# Patient Record
Sex: Male | Born: 1975
Health system: Southern US, Community
[De-identification: ages and names within clinical notes are randomized; demographics above are authoritative.]

## PROBLEM LIST (undated history)

## (undated) DIAGNOSIS — E781 Pure hyperglyceridemia: Secondary | ICD-10-CM

## (undated) DIAGNOSIS — R740 Nonspecific elevation of levels of transaminase and lactic acid dehydrogenase [LDH]: Secondary | ICD-10-CM

## (undated) DIAGNOSIS — I10 Essential (primary) hypertension: Secondary | ICD-10-CM

## (undated) DIAGNOSIS — E8881 Metabolic syndrome: Secondary | ICD-10-CM

## (undated) DIAGNOSIS — R030 Elevated blood-pressure reading, without diagnosis of hypertension: Secondary | ICD-10-CM

## (undated) HISTORY — DX: Metabolic syndrome: E88.81

## (undated) HISTORY — DX: Essential (primary) hypertension: I10

## (undated) HISTORY — DX: Elevated blood-pressure reading, without diagnosis of hypertension: R03.0

## (undated) HISTORY — DX: Morbid (severe) obesity due to excess calories: E66.01

## (undated) HISTORY — DX: Nonspecific elevation of levels of transaminase and lactic acid dehydrogenase (ldh): R74.0

## (undated) HISTORY — DX: Pure hyperglyceridemia: E78.1

---
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
## 2005-11-08 ENCOUNTER — Emergency Department (HOSPITAL_COMMUNITY): Admission: EM | Admit: 2005-11-08 | Discharge: 2005-11-08 | Payer: Self-pay | Admitting: Emergency Medicine

## 2007-12-28 ENCOUNTER — Emergency Department (HOSPITAL_BASED_OUTPATIENT_CLINIC_OR_DEPARTMENT_OTHER): Admission: EM | Admit: 2007-12-28 | Discharge: 2007-12-28 | Payer: Self-pay | Admitting: Emergency Medicine

## 2007-12-28 ENCOUNTER — Ambulatory Visit: Payer: Self-pay | Admitting: Diagnostic Radiology
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
## 2008-05-01 ENCOUNTER — Ambulatory Visit: Payer: Self-pay | Admitting: Internal Medicine

## 2008-05-01 DIAGNOSIS — R002 Palpitations: Secondary | ICD-10-CM | POA: Insufficient documentation

## 2008-05-01 LAB — CONVERTED CEMR LAB
BUN: 8 mg/dL (ref 6–23)
Basophils Absolute: 0.1 10*3/uL (ref 0.0–0.1)
Bilirubin, Direct: 0 mg/dL (ref 0.0–0.3)
Cholesterol: 191 mg/dL (ref 0–200)
Creatinine, Ser: 0.8 mg/dL (ref 0.4–1.5)
GFR calc non Af Amer: 118.43 mL/min (ref >60)
Glucose, Bld: 81 mg/dL (ref 70–99)
HCT: 41.5 % (ref 39.0–52.0)
HDL: 32.3 mg/dL — ABNORMAL LOW (ref >39.00)
Leukocytes, UA: NEGATIVE
Lymphs Abs: 3.1 10*3/uL (ref 0.7–4.0)
MCV: 81.9 fL (ref 78.0–100.0)
Monocytes Absolute: 0.9 10*3/uL (ref 0.1–1.0)
Monocytes Relative: 8.3 % (ref 3.0–12.0)
Neutrophils Relative %: 60.9 % (ref 43.0–77.0)
Nitrite: NEGATIVE
Platelets: 265 10*3/uL (ref 150.0–400.0)
Potassium: 4.1 meq/L (ref 3.5–5.1)
RDW: 13 % (ref 11.5–14.6)
Specific Gravity, Urine: >=1.03 (ref 1.000–1.030)
TSH: 2.03 microintl units/mL (ref 0.35–5.50)
Total Bilirubin: 0.5 mg/dL (ref 0.3–1.2)
Total Protein, Urine: NEGATIVE mg/dL
VLDL: 44 mg/dL — ABNORMAL HIGH (ref 0.0–40.0)
WBC: 11.4 10*3/uL — ABNORMAL HIGH (ref 4.5–10.5)
pH: 5 (ref 5.0–8.0)

## 2008-05-03 ENCOUNTER — Encounter: Payer: Self-pay | Admitting: Internal Medicine

## 2008-10-31 ENCOUNTER — Ambulatory Visit: Payer: Self-pay | Admitting: Internal Medicine

## 2009-10-27 DIAGNOSIS — E8881 Metabolic syndrome: Secondary | ICD-10-CM

## 2009-10-27 DIAGNOSIS — E781 Pure hyperglyceridemia: Secondary | ICD-10-CM

## 2009-10-27 DIAGNOSIS — R7401 Elevation of levels of liver transaminase levels: Secondary | ICD-10-CM

## 2009-10-27 HISTORY — DX: Elevation of levels of liver transaminase levels: R74.01

## 2009-10-27 HISTORY — DX: Pure hyperglyceridemia: E78.1

## 2009-10-27 HISTORY — DX: Metabolic syndrome: E88.81

## 2009-11-07 ENCOUNTER — Ambulatory Visit: Payer: Self-pay | Admitting: Internal Medicine

## 2009-11-07 ENCOUNTER — Encounter: Payer: Self-pay | Admitting: Internal Medicine

## 2009-11-07 LAB — CONVERTED CEMR LAB
ALT: 62 units/L — ABNORMAL HIGH (ref 0–53)
AST: 34 units/L (ref 0–37)
Albumin: 3.9 g/dL (ref 3.5–5.2)
Basophils Absolute: 0.1 10*3/uL (ref 0.0–0.1)
CO2: 30 meq/L (ref 19–32)
Chloride: 106 meq/L (ref 96–112)
GFR calc non Af Amer: 132.52 mL/min (ref >60)
Glucose, Bld: 93 mg/dL (ref 70–99)
HCT: 39.8 % (ref 39.0–52.0)
Hemoglobin: 13.6 g/dL (ref 13.0–17.0)
Lymphs Abs: 3.4 10*3/uL (ref 0.7–4.0)
MCV: 83.2 fL (ref 78.0–100.0)
Monocytes Relative: 6.3 % (ref 3.0–12.0)
Neutro Abs: 6.5 10*3/uL (ref 1.4–7.7)
Potassium: 4 meq/L (ref 3.5–5.1)
RDW: 14.1 % (ref 11.5–14.6)
Sodium: 141 meq/L (ref 135–145)
TSH: 2.21 microintl units/mL (ref 0.35–5.50)

## 2009-11-19 ENCOUNTER — Ambulatory Visit: Payer: Self-pay | Admitting: Internal Medicine

## 2009-11-19 DIAGNOSIS — K7689 Other specified diseases of liver: Secondary | ICD-10-CM | POA: Insufficient documentation

## 2009-11-19 DIAGNOSIS — R7309 Other abnormal glucose: Secondary | ICD-10-CM | POA: Insufficient documentation

## 2009-12-03 ENCOUNTER — Encounter
Admission: RE | Admit: 2009-12-03 | Discharge: 2009-12-03 | Payer: Self-pay | Source: Home / Self Care | Attending: Internal Medicine | Admitting: Internal Medicine

## 2010-02-13 ENCOUNTER — Telehealth: Payer: Self-pay | Admitting: Family Medicine

## 2010-02-20 ENCOUNTER — Ambulatory Visit: Admit: 2010-02-20 | Payer: Self-pay | Admitting: Internal Medicine

## 2010-02-26 NOTE — Letter (Signed)
Summary: Results Follow-up Letter  [facility]  [address] [place], [place] [address]   Phone: [phone]  Fax: [phone]    11/07/2009  [address] [place], [place]  [address]  Dear Mr. BRITTEN,   The following are the results of your recent test(s):  Test     Result     Blood sugars   slightly elevated, early type II diabetes Liver       one enzyme was slightly elevated Kidney     normal CBC       very slightly elevated white blood cell count Thyroid     normal  _________________________________________________________  Please call for an appointment soon _________________________________________________________ _________________________________________________________ _________________________________________________________  Sincerely,  Sanda Linger MD [facility]

## 2010-02-26 NOTE — Letter (Signed)
Summary: Lipid Letter  [facility]  [address] [place], [place] [address]   Phone: [phone]  Fax: [phone]    11/07/2009  Chad [name] [address] [place], [place]  [address]  Dear Chad [name]:  We have carefully reviewed your last lipid profile from  and the results are noted below with a summary of recommendations for lipid management.    Cholesterol:       208     Goal: <200   HDL "good" Cholesterol:   62.95     Goal: >40   LDL "bad" Cholesterol:   138     Goal: <100   Triglycerides:       208.0     Goal: <150        TLC Diet (Therapeutic Lifestyle Change): Saturated Fats & Transfatty acids should be kept < 7% of total calories ***Reduce Saturated Fats Polyunstaurated Fat can be up to 10% of total calories Monounsaturated Fat Fat can be up to 20% of total calories Total Fat should be no greater than 25-35% of total calories Carbohydrates should be 50-60% of total calories Protein should be approximately 15% of total calories Fiber should be at least 20-30 grams a day ***Increased fiber may help lower LDL Total Cholesterol should be < 200mg /day Consider adding plant stanol/sterols to diet (example: Benacol spread) ***A higher intake of unsaturated fat may reduce Triglycerides and Increase HDL    Adjunctive Measures (may lower LIPIDS and reduce risk of Heart Attack) include: Aerobic Exercise (20-30 minutes 3-4 times a week) Limit Alcohol Consumption Weight Reduction Aspirin 75-81 mg a day by mouth (if not allergic or contraindicated) Dietary Fiber 20-30 grams a day by mouth     Current Medications:  None If you have any questions, please call. We appreciate being able to work with you.   Sincerely,    [facility] Etta Grandchild MD

## 2010-02-26 NOTE — Assessment & Plan Note (Signed)
Summary: Cpx/will come fasting/cd   Vital Signs:  Patient profile:   35 year old male Height:      71 inches Weight:      363 pounds BMI:     50.81 O2 Sat:      96 % on Room air Temp:     98.8 degrees F oral Pulse rate:   65 / minute Pulse rhythm:   regular Resp:     16 per minute BP sitting:   126 / 82  (left arm) Cuff size:   large  Vitals Entered By: Rock Nephew CMA (November 07, 2009 8:41 AM)  Nutrition Counseling: Patient's BMI is greater than 25 and therefore counseled on weight management options.  O2 Flow:  Room air CC: Patient here for yearly work physical Is Patient Diabetic? No Pain Assessment Patient in pain? no       Does patient need assistance? Functional Status Self care Ambulation Normal   Primary Care Provider:  Etta Grandchild MD  CC:  Patient here for yearly work physical.  History of Present Illness:  Follow-Up Visit      This is a 35 year old man who presents for Follow-up visit.  The patient denies chest pain, palpitations, dizziness, syncope, edema, SOB, DOE, PND, and orthopnea.  Since the last visit the patient notes no new problems or concerns.    Preventive Screening-Counseling & Management  Alcohol-Tobacco     Alcohol drinks/day: <1     Alcohol type: all     >5/day in last 3 mos: no     Alcohol Counseling: not indicated; use of alcohol is not excessive or problematic     Feels need to cut down: no     Feels annoyed by complaints: no     Feels guilty re: drinking: no     Needs 'eye opener' in am: no     Smoking Status: quit > 6 months     Year Started: 1995     Year Quit: 2005     Pack years: 10     Tobacco Counseling: to remain off tobacco products  Hep-HIV-STD-Contraception     Hepatitis Risk: no risk noted     STD Risk: no risk noted     Dental Visit-last 6 months yes     Dental Care Counseling: to seek dental care; no dental care within six months     TSE monthly: yes     Testicular SE Education/Counseling to perform  regular STE     Sun Exposure-Excessive: no  Safety-Violence-Falls     Seat Belt Use: yes     Helmet Use: n/a     Firearms in the Home: no firearms in the home     Smoke Detectors: yes     Violence in the Home: no risk noted      Sexual History:  currently monogamous.        Drug Use:  never.        Blood Transfusions:  no.    Clinical Review Panels:  Lipid Management   Cholesterol:  191 (05/01/2008)   HDL (good cholesterol):  32.30 (05/01/2008)  Diabetes Management   Creatinine:  0.8 (05/01/2008)  CBC   WBC:  11.4 (05/01/2008)   RBC:  5.06 (05/01/2008)   Hgb:  14.5 (05/01/2008)   Hct:  41.5 (05/01/2008)   Platelets:  265.0 (05/01/2008)   MCV  81.9 (05/01/2008)   MCHC  35.1 (05/01/2008)   RDW  13.0 (05/01/2008)   PMN:  60.9 (05/01/2008)   Lymphs:  26.8 (05/01/2008)   Monos:  8.3 (05/01/2008)   Eosinophils:  3.4 (05/01/2008)   Basophil:  0.6 (05/01/2008)  Complete Metabolic Panel   Glucose:  81 (05/01/2008)   Sodium:  141 (05/01/2008)   Potassium:  4.1 (05/01/2008)   Chloride:  106 (05/01/2008)   CO2:  30 (05/01/2008)   BUN:  8 (05/01/2008)   Creatinine:  0.8 (05/01/2008)   Albumin:  3.9 (05/01/2008)   Total Protein:  7.2 (05/01/2008)   Calcium:  9.4 (05/01/2008)   Total Bili:  0.5 (05/01/2008)   Alk Phos:  99 (05/01/2008)   SGPT (ALT):  41 (05/01/2008)   SGOT (AST):  23 (05/01/2008)   Medications Prior to Update: 1)  None  Current Medications (verified): 1)  None  Allergies (verified): 1)  ! Ibuprofen 2)  ! * Aleve 3)  ! * Excedrin 4)  ! Jonne Ply  Past History:  Past Medical History: Last updated: 05/01/2008 Unremarkable  Past Surgical History: Last updated: 05/01/2008 Denies surgical history  Family History: Last updated: 05/01/2008 Family History Breast cancer 1st degree relative <50 Family History Diabetes 1st degree relative Family History Hypertension  Social History: Last updated: 11/07/2009 Alcohol use-no Drug use-no Regular  exercise-yes [employer]  Risk Factors: Alcohol Use: <1 (11/07/2009) >5 drinks/d w/in last 3 months: no (11/07/2009) Exercise: yes (05/01/2008)  Risk Factors: Smoking Status: quit > 6 months (11/07/2009)  Family History: Reviewed history from 05/01/2008 and no changes required. Family History Breast cancer 1st degree relative <50 Family History Diabetes 1st degree relative Family History Hypertension  Social History: Reviewed history from 05/01/2008 and no changes required. Alcohol use-no Drug use-no Regular exercise-yes [facility] w/in 6 mos.:  yes Sun Exposure-Excessive:  no Seat Belt Use:  yes Drug Use:  never  Review of Systems       The patient complains of weight gain.  The patient denies anorexia, fever, weight loss, chest pain, syncope, dyspnea on exertion, peripheral edema, prolonged cough, headaches, hemoptysis, abdominal pain, melena, hematochezia, severe indigestion/heartburn, hematuria, suspicious skin lesions, enlarged lymph nodes, and testicular masses.    Physical Exam  General:  alert, well-developed, well-nourished, well-hydrated, and overweight-appearing.   Head:  normocephalic, atraumatic, no abnormalities observed, and no abnormalities palpated.   Eyes:  vision grossly intact, pupils equal, pupils round, and pupils reactive to light.   Mouth:  Oral mucosa and oropharynx without lesions or exudates.  Teeth in good repair. Neck:  supple, full ROM, no masses, no thyromegaly, no thyroid nodules or tenderness, no JVD, normal carotid upstroke, no carotid bruits, no cervical lymphadenopathy, and no neck tenderness.   Lungs:  Normal respiratory effort, chest expands symmetrically. Lungs are clear to auscultation, no crackles or wheezes. Heart:  Normal rate and regular rhythm. S1 and S2 normal without gallop, murmur, click, rub or other extra sounds. Abdomen:  soft, non-tender, normal bowel sounds, no distention, no masses, no guarding, no rigidity, no  rebound tenderness, no abdominal hernia, no inguinal hernia, no hepatomegaly, and no splenomegaly.   Genitalia:  uncircumcised, no hydrocele, no varicocele, no scrotal masses, no testicular masses or atrophy, no cutaneous lesions, and no urethral discharge.   Msk:  No deformity or scoliosis noted of thoracic or lumbar spine.   Pulses:  R and L carotid,radial,femoral,dorsalis pedis and posterior tibial pulses are full and equal bilaterally Extremities:  No clubbing, cyanosis, edema, or deformity noted with normal full range of motion of all joints.   Neurologic:  No cranial nerve  deficits noted. Station and gait are normal. Plantar reflexes are down-going bilaterally. DTRs are symmetrical throughout. Sensory, motor and coordinative functions appear intact. Skin:  Intact without suspicious lesions or rashes Cervical Nodes:  no anterior cervical adenopathy and no posterior cervical adenopathy.   Axillary Nodes:  no R axillary adenopathy and no L axillary adenopathy.   Inguinal Nodes:  no R inguinal adenopathy and no L inguinal adenopathy.   Psych:  Cognition and judgment appear intact. Alert and cooperative with normal attention span and concentration. No apparent delusions, illusions, hallucinations   Impression & Recommendations:  Problem # 1:  PALPITATIONS, RECURRENT (ICD-785.1) Assessment Improved  Orders: Venipuncture (40981) TLB-Lipid Panel (80061-LIPID) TLB-BMP (Basic Metabolic Panel-BMET) (80048-METABOL) TLB-CBC Platelet - w/Differential (85025-CBCD) TLB-Hepatic/Liver Function Pnl (80076-HEPATIC) TLB-TSH (Thyroid Stimulating Hormone) (84443-TSH) TLB-A1C / Hgb A1C (Glycohemoglobin) (83036-A1C) EKG w/ Interpretation (93000)  Problem # 2:  OBESITY, MORBID (ICD-278.01) Assessment: Deteriorated  Ht: 71 (11/07/2009)   Wt: 363 (11/07/2009)   BMI: 50.81 (11/07/2009)  Orders: Venipuncture (19147) TLB-Lipid Panel (80061-LIPID) TLB-BMP (Basic Metabolic Panel-BMET)  (80048-METABOL) TLB-CBC Platelet - w/Differential (85025-CBCD) TLB-Hepatic/Liver Function Pnl (80076-HEPATIC) TLB-TSH (Thyroid Stimulating Hormone) (84443-TSH) TLB-A1C / Hgb A1C (Glycohemoglobin) (83036-A1C)  Problem # 3:  ROUTINE GENERAL MEDICAL EXAM@HEALTH  CARE FACL (ICD-V70.0) Assessment: New  Orders: Venipuncture (82956) TLB-Lipid Panel (80061-LIPID) TLB-BMP (Basic Metabolic Panel-BMET) (80048-METABOL) TLB-CBC Platelet - w/Differential (85025-CBCD) TLB-Hepatic/Liver Function Pnl (80076-HEPATIC) TLB-TSH (Thyroid Stimulating Hormone) (84443-TSH) TLB-A1C / Hgb A1C (Glycohemoglobin) (83036-A1C)  Chol: 191 (05/01/2008)   HDL: 32.30 (05/01/2008)   TG: 220.0 (05/01/2008) TSH: 2.03 (05/01/2008)    Discussed using sunscreen, use of alcohol, drug use, self testicular exam, routine dental care, routine eye care, routine physical exam, seat belts, multiple vitamins,  and recommendations for immunizations.  Discussed exercise and checking cholesterol.    Patient Instructions: 1)  Please schedule a follow-up appointment as needed. 2)  It is important that you exercise regularly at least 20 minutes 5 times a week. If you develop chest pain, have severe difficulty breathing, or feel very tired , stop exercising immediately and seek medical attention. 3)  You need to lose weight. Consider a lower calorie diet and regular exercise.  4)  If you could be exposed to sexually transmitted diseases, you should use a condom.   Not Administered:    Influenza Vaccine not given due to: declined

## 2010-02-26 NOTE — Assessment & Plan Note (Signed)
Summary: FU ON LABS PER LETTER/ NWS #   Vital Signs:  Patient profile:   35 year old male Height:      71 inches Weight:      369 pounds O2 Sat:      96 % on Room air Temp:     97.1 degrees F oral Pulse rate:   92 / minute Pulse rhythm:   regular Resp:     16 per minute BP sitting:   142 / 78  (left arm)  Vitals Entered By: Jarome Lamas (November 19, 2009 8:32 AM)  O2 Flow:  Room air CC: fl/up /pb Is Patient Diabetic? Yes Pain Assessment Patient in pain? no      Comments sore throat   Primary Care Stanley Lyness:  Etta Grandchild MD  CC:  fl/up /pb.  History of Present Illness: He returns today to discuss his recent labs. His A1C= 6.3 and his ALT was very slightly elevated c/w fatty liver disease. His lipid profile is mildly adverse. He says that he is committed to lifestyle changes and he does not want to consider gastric bypass surgery or to take any meds for now.    Preventive Screening-Counseling & Management  Alcohol-Tobacco     Alcohol drinks/day: <1     Alcohol type: all     >5/day in last 3 mos: no     Alcohol Counseling: not indicated; use of alcohol is not excessive or problematic     Feels need to cut down: no     Feels annoyed by complaints: no     Feels guilty re: drinking: no     Needs 'eye opener' in am: no     Smoking Status: quit > 6 months     Year Started: 1995     Year Quit: 2005     Pack years: 10     Tobacco Counseling: to remain off tobacco products  Hep-HIV-STD-Contraception     Hepatitis Risk: no risk noted     STD Risk: no risk noted     Dental Visit-last 6 months yes     Dental Care Counseling: to seek dental care; no dental care within six months     TSE monthly: yes     Testicular SE Education/Counseling to perform regular STE     Sun Exposure-Excessive: no      Sexual History:  currently monogamous.        Drug Use:  never.        Blood Transfusions:  no.    Clinical Review Panels:  Lipid Management   Cholesterol:  208  (11/07/2009)   HDL (good cholesterol):  38.50 (11/07/2009)  Diabetes Management   HgBA1C:  6.3 (11/07/2009)   Creatinine:  0.7 (11/07/2009)  CBC   WBC:  10.9 (11/07/2009)   RBC:  4.78 (11/07/2009)   Hgb:  13.6 (11/07/2009)   Hct:  39.8 (11/07/2009)   Platelets:  265.0 (11/07/2009)   MCV  83.2 (11/07/2009)   MCHC  34.2 (11/07/2009)   RDW  14.1 (11/07/2009)   PMN:  60.0 (11/07/2009)   Lymphs:  31.6 (11/07/2009)   Monos:  6.3 (11/07/2009)   Eosinophils:  1.6 (11/07/2009)   Basophil:  0.5 (11/07/2009)  Complete Metabolic Panel   Glucose:  93 (11/07/2009)   Sodium:  141 (11/07/2009)   Potassium:  4.0 (11/07/2009)   Chloride:  106 (11/07/2009)   CO2:  30 (11/07/2009)   BUN:  10 (11/07/2009)   Creatinine:  0.7 (11/07/2009)   Albumin:  3.9 (11/07/2009)   Total Protein:  6.9 (11/07/2009)   Calcium:  9.1 (11/07/2009)   Total Bili:  0.5 (11/07/2009)   Alk Phos:  105 (11/07/2009)   SGPT (ALT):  62 (11/07/2009)   SGOT (AST):  34 (11/07/2009)   Medications Prior to Update: 1)  None  Current Medications (verified): 1)  None  Allergies (verified): 1)  ! Ibuprofen 2)  ! * Aleve 3)  ! * Excedrin 4)  ! Jonne Ply  Past History:  Past Medical History: Last updated: 05/01/2008 Unremarkable  Past Surgical History: Last updated: 05/01/2008 Denies surgical history  Family History: Last updated: 05/01/2008 Family History Breast cancer 1st degree relative <50 Family History Diabetes 1st degree relative Family History Hypertension  Social History: Last updated: 11/07/2009 Alcohol use-no Drug use-no Regular exercise-yes [employer]  Risk Factors: Alcohol Use: <1 (11/19/2009) >5 drinks/d w/in last 3 months: no (11/19/2009) Exercise: yes (05/01/2008)  Risk Factors: Smoking Status: quit > 6 months (11/19/2009)  Family History: Reviewed history from 05/01/2008 and no changes required. Family History Breast cancer 1st degree relative <50 Family History Diabetes 1st degree  relative Family History Hypertension  Social History: Reviewed history from 11/07/2009 and no changes required. Alcohol use-no Drug use-no Regular exercise-yes [employer]  Review of Systems       The patient complains of weight gain.  The patient denies chest pain, dyspnea on exertion, peripheral edema, headaches, hemoptysis, and abdominal pain.   GI:  Denies abdominal pain, change in bowel habits, diarrhea, indigestion, loss of appetite, nausea, vomiting, and yellowish skin color. Endo:  Denies cold intolerance, excessive hunger, excessive thirst, excessive urination, heat intolerance, and polyuria.  Physical Exam  General:  alert, well-developed, well-nourished, well-hydrated, and overweight-appearing.   Mouth:  Oral mucosa and oropharynx without lesions or exudates.  Teeth in good repair. Neck:  supple, full ROM, no masses, no thyromegaly, no thyroid nodules or tenderness, no JVD, normal carotid upstroke, no carotid bruits, no cervical lymphadenopathy, and no neck tenderness.   Lungs:  Normal respiratory effort, chest expands symmetrically. Lungs are clear to auscultation, no crackles or wheezes. Heart:  Normal rate and regular rhythm. S1 and S2 normal without gallop, murmur, click, rub or other extra sounds. Abdomen:  soft, non-tender, normal bowel sounds, no distention, no masses, no guarding, no rigidity, no rebound tenderness, no abdominal hernia, no inguinal hernia, no hepatomegaly, and no splenomegaly.   Msk:  No deformity or scoliosis noted of thoracic or lumbar spine.   Pulses:  R and L carotid,radial,femoral,dorsalis pedis and posterior tibial pulses are full and equal bilaterally Extremities:  No clubbing, cyanosis, edema, or deformity noted with normal full range of motion of all joints.   Neurologic:  No cranial nerve deficits noted. Station and gait are normal. Plantar reflexes are down-going bilaterally. DTRs are symmetrical throughout. Sensory, motor and coordinative  functions appear intact. Skin:  Intact without suspicious lesions or rashes Cervical Nodes:  no anterior cervical adenopathy and no posterior cervical adenopathy.   Axillary Nodes:  no R axillary adenopathy and no L axillary adenopathy.   Psych:  Cognition and judgment appear intact. Alert and cooperative with normal attention span and concentration. No apparent delusions, illusions, hallucinations   Impression & Recommendations:  Problem # 1:  FATTY LIVER DISEASE (ICD-571.8) Assessment New  Problem # 2:  HYPERGLYCEMIA (ICD-790.29) Assessment: New  Orders: Nutrition Referral (Nutrition)  Problem # 3:  OBESITY, MORBID (ICD-278.01) Assessment: Deteriorated  Ht: 71 (11/19/2009)   Wt: 369 (11/19/2009)   BMI: 50.81 (  11/07/2009)  Orders: Nutrition Referral (Nutrition)  Patient Instructions: 1)  Please schedule a follow-up appointment in 4 months. 2)  It is important that you exercise regularly at least 20 minutes 5 times a week. If you develop chest pain, have severe difficulty breathing, or feel very tired , stop exercising immediately and seek medical attention. 3)  You need to lose weight. Consider a lower calorie diet and regular exercise.    Orders Added: 1)  Nutrition Referral [Nutrition] 2)  Est. Patient Level III [09811]    Prevention & Chronic Care Immunizations   Influenza vaccine: Not documented   Influenza vaccine deferral: Refused  (11/19/2009)    Tetanus booster: Not documented   Td booster deferral: Refused  (11/19/2009)    Pneumococcal vaccine: Not documented  Other Screening   Smoking status: quit > 6 months  (11/19/2009)

## 2010-02-28 NOTE — Progress Notes (Signed)
Summary: Transfer PCP  ---- 02/12/2010 4:53 PM, Elizebeth Brooking Kimmy Parish M.D. wrote: Fine with me.  --- Converted from flag ---- ---- 02/13/2010 7:36 AM, Etta Grandchild MD wrote: yes  ---- 02/12/2010 4:35 PM, Lannette Donath wrote: Pt would like to switch from [facility] to [facility], it is closer for him, is it okay? ------------------------------

## 2010-03-07 ENCOUNTER — Encounter: Payer: Self-pay | Admitting: Family Medicine

## 2010-03-07 ENCOUNTER — Ambulatory Visit (INDEPENDENT_AMBULATORY_CARE_PROVIDER_SITE_OTHER): Payer: Commercial Managed Care - PPO | Admitting: Family Medicine

## 2010-03-07 DIAGNOSIS — J020 Streptococcal pharyngitis: Secondary | ICD-10-CM | POA: Insufficient documentation

## 2010-03-07 DIAGNOSIS — R7309 Other abnormal glucose: Secondary | ICD-10-CM

## 2010-03-07 DIAGNOSIS — K7689 Other specified diseases of liver: Secondary | ICD-10-CM

## 2010-03-07 DIAGNOSIS — D72829 Elevated white blood cell count, unspecified: Secondary | ICD-10-CM

## 2010-03-07 LAB — CONVERTED CEMR LAB
AST: 13 units/L (ref 0–37)
Albumin: 4.5 g/dL (ref 3.5–5.2)
Alkaline Phosphatase: 92 units/L (ref 39–117)
Basophils Relative: 0 % (ref 0–1)
Eosinophils Absolute: 0 10*3/uL (ref 0.0–0.7)
Hgb A1c MFr Bld: 5.6 % (ref <5.7)
LDL Cholesterol: 109 mg/dL — ABNORMAL HIGH (ref 0–99)
MCHC: 33.8 g/dL (ref 30.0–36.0)
MCV: 84.9 fL (ref 78.0–100.0)
Neutrophils Relative %: 77 % (ref 43–77)
Platelets: 239 10*3/uL (ref 150–400)
Potassium: 4 meq/L (ref 3.5–5.3)
Sodium: 141 meq/L (ref 135–145)
Total Protein: 7.4 g/dL (ref 6.0–8.3)
WBC: 16.5 10*3/uL — ABNORMAL HIGH (ref 4.0–10.5)

## 2010-03-08 ENCOUNTER — Telehealth (INDEPENDENT_AMBULATORY_CARE_PROVIDER_SITE_OTHER): Payer: Self-pay | Admitting: *Deleted

## 2010-03-08 ENCOUNTER — Ambulatory Visit: Payer: Self-pay | Admitting: Family Medicine

## 2010-03-14 NOTE — Assessment & Plan Note (Signed)
Summary: Transf PCP est care/dt   Vital Signs:  Patient profile:   35 year old male Height:      71 inches Weight:      327 pounds BMI:     45.77 O2 Sat:      97 % on Room air Temp:     98.2 degrees F oral Pulse rate:   73 / minute BP sitting:   130 / 84  (right arm) Cuff size:   large  Vitals Entered By: Francee Piccolo CMA Duncan Dull) (March 07, 2010 9:07 AM)  O2 Flow:  Room air CC: sore throat since Tuesday night, no fever or congestion//SP, URI symptoms   History of Present Illness:       This is a 35 year old man who presents with CC of sore throat x 2 days.  The patient complains of sore throat and subjective fever/chills, but denies nasal congestion, clear nasal discharge, dry cough, productive cough, and earache.  The patient denies dyspnea and wheezing.  The patient denies itchy watery eyes, itchy throat, sneezing, seasonal symptoms, response to antihistamine, headache, muscle aches, and severe fatigue.   Pt is new to me but formerly saw Dr. Yetta Barre in [place] at [facility]. lives in [place] so this office is more convenient for him. He's been working hard on diet since last o/v in October 2011 when HbA1c was 6.3% and he weighed 369 lbs---went to one nutrition class.  He has cut out colas, fast food, beer, sweets, and most red meat and fried foods. He has not started exercising yet but plans on starting soon, with a goal of 1 hour of cardio on 3-5 days per week.  Preventive Screening-Counseling & Management  Alcohol-Tobacco     Alcohol drinks/day: 0     Smoking Status: never  Current Medications (verified): 1)  Multivitamins  Tabs (Multiple Vitamin) .... Take 1 Tablet By Mouth Once A Day  Allergies (verified): 1)  ! Ibuprofen 2)  ! * Aleve 3)  ! * Excedrin 4)  ! Jonne Ply  Past History:  Past Surgical History: Last updated: 05/01/2008 Denies surgical history  Family History: Last updated: 05/01/2008 Family History Breast cancer 1st degree relative  <50 Family History Diabetes 1st degree relative Family History Hypertension  Social History: Last updated: 03/07/2010 Engaged, has 35 y/o son, lives in [place], [place].  Originally from [place], [place]. 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    Risk Factors: Alcohol Use: 0 (03/07/2010) >5 drinks/d w/in last 3 months: no (11/19/2009) Exercise: yes (05/01/2008)  Risk Factors: Smoking Status: never (03/07/2010)  Past Medical History: Morbid obesity Insulin resistance (HbA1c 6.3 % 10/2009) Transaminasemia (presumed fatty liver) 10/2009 Hypertriglyceridemia (mild) 10/2009  Social History: Engaged, has 35 y/o son, lives in [place], [place].  Originally from [place], [place]. 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  Smoking Status:  never  Review of Systems  The patient denies anorexia, fever, weight gain, vision loss, decreased hearing, hoarseness, chest pain, syncope, dyspnea on exertion, peripheral edema, prolonged cough, headaches, hemoptysis, abdominal pain, melena, hematochezia, severe indigestion/heartburn, hematuria, incontinence, genital sores, muscle weakness, suspicious skin lesions, transient blindness, difficulty walking, depression, unusual weight change, abnormal bleeding, enlarged lymph nodes, angioedema, breast masses, and testicular masses.         He reports some fleeting right groin pain with ejaculation and shortly afterwards last week.  No swelling, no ongoing pain.  Physical Exam  General:  VS: noted, all normal. Gen: Alert, well appearing, oriented x 4. HEENT: Scalp without lesions or hair loss.  Ears: EACs clear, normal epithelium.  TMs with good light reflex and landmarks bilaterally.  Eyes: no injection, icteris, swelling, or exudate.  EOMI, PERRLA. Nose: no drainage or turbinate edema/swelling.  No injection or focal lesion.  Mouth: lips without  lesion/swelling.  Oral mucosa pink and moist.  Dentition intact and without obvious caries or gingival swelling.  Oropharynx with erythema, diffuse tonsillar enlargement, erythema, and exudate.  Neck: supple.  No lymphadenopathy, thyromegaly, or mass. Chest: symmetric expansion, with nonlabored respirations.  Clear and equal breath sounds in all lung fields.   CV: RRR, no m/r/g.  Peripheral pulses 2+/symmetric. EXT: no clubbing, cyanosis, or edema.  SKIN: no rash    Impression & Recommendations:  Problem # 1:  STREPTOCOCCAL PHARYNGITIS (ICD-034.0) Assessment New His rapid strep was POSITIVE.  His updated medication list for this problem includes:    Amoxicillin 875 Mg Tabs (Amoxicillin) .Marland Kitchen... 1 tab by mouth two times a day x 10d  Orders: Rapid Strep (44034)  Problem # 2:  OBESITY, MORBID (ICD-278.01) Assessment: Improved Congratulated pt on wt loss success.  Encouraged ongoing lifestyle change, particularly in regards to exercise. Orders: T-Lipid Profile ([id number]) T-Comprehensive Metabolic Panel [phone])  Problem # 3:  HYPERGLYCEMIA (ICD-790.29) Assessment: Comment Only Will recheck labs (fasting) today. Orders: T-Lipid Profile [id number]) T-Comprehensive Metabolic Panel [id number]) T-CBC w/Diff [id number]) T- Hemoglobin A1C ([id number])  Problem # 4:  FATTY LIVER DISEASE (ICD-571.8) Assessment: Comment Only If ALT still up, will make sure hep serology neg and consider u/s to confirm suspicion of hepatic steatosis.  Orders: T-Lipid Profile ([id number]) T-Comprehensive Metabolic Panel [phone]) T-CBC w/Diff ([id number])  Problem # 5:  LEUKOCYTOSIS (ICD-288.60) Assessment: Comment Only WBC 11K range with normal diff on 2 occasions in the past.  Will recheck this today. Orders: T-CBC w/Diff ([id number])  Complete Medication List: 1)  Multivitamins Tabs (Multiple vitamin) .... Take 1 tablet by mouth once a day 2)  Amoxicillin 875 Mg Tabs  (Amoxicillin) .Marland Kitchen.. 1 tab by mouth two times a day x 10d  Patient Instructions: 1)  take orders to [facility] on [address] for lab work today--fasting. 2)  Take all 10 days of your antibiotic for strep throat.   3)  Call for problems.   4)  We'll arrange a plan for f/u after getting lab results back. Prescriptions: AMOXICILLIN 875 MG TABS (AMOXICILLIN) 1 tab by mouth two times a day X 10d  #20 x 0   Entered and Authorized by:   Michell Heinrich M.D.   Signed by:   Michell Heinrich M.D. on 03/07/2010   Method used:   [facility]  [address] [id number]       [address] [place], [place]  [address]       Ph: [phone] or [phone]       Fax: [phone]   RxID:   [id number]    Orders Added: 1)  T-Lipid Profile [phone] 2)  T-Comprehensive Metabolic Panel [[id number]] 3)  T-CBC w/Diff [[id number]] 4)  Est. Patient Level IV [93790] 5)  Rapid Strep [87880] 6)  T- Hemoglobin A1C [[id number]]  Appended Document: Transf PCP est care/dt Clinical Lists Changes     Laboratory Results  Date/Time Received: March 07, 2010 10:10 AM Date/Time  Reported: March 07, 2010 10:10 AM  Other Tests  Rapid Strep: positive

## 2010-03-14 NOTE — Progress Notes (Signed)
Summary: Lab results  Phone Note Other Incoming   Summary of Call: Pls notify: ALL llabs yesterday were EXCELLENT.  He may want to know what some specific numbers are, and he can come by and get a copy of the labs any time if he wants.  Tell him to keep up the great work!  A yearly visit for a physical and fasting lab work is my plan for follow up. Initial call taken by: Michell Heinrich M.D.,  March 08, 2010 8:22 AM  Follow-up for Phone Call        pt notified of above.  he is excited about these numbers.  pt requested a copy of lab be mailed to him and I will do this. Follow-up by: Francee Piccolo CMA Duncan Dull),  March 08, 2010 3:35 PM

## 2010-03-19 IMAGING — CT CT HEAD W/O CM
1 series · 16 of 30 positions shown, 20 images · non-contrast
Comparison: Unenhanced cranial CT 11/08/2005.

CLINICAL DATA: Headache.  History of hypertension.

CT HEAD WITHOUT CONTRAST 12/28/2007:
TECHNIQUE: Contiguous axial images were obtained from the base of
the skull through the vertex without contrast.

[Series 2: head 4.8 h37s · axial · 0.46mm/px · z∈[+1262,+1418]mm · 16 of 36 slices shown, 20 images]
[im 2/36  brain]
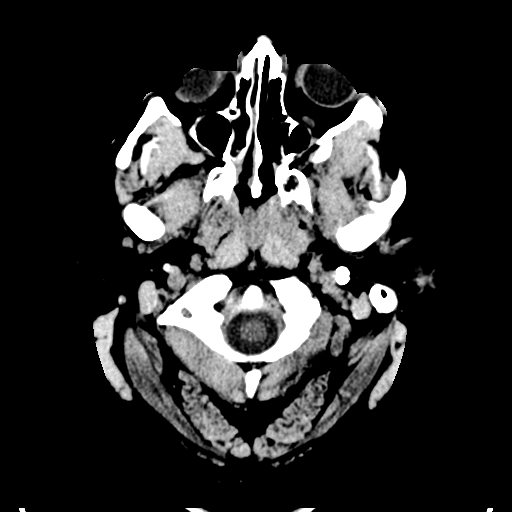
[im 2/36  bone]
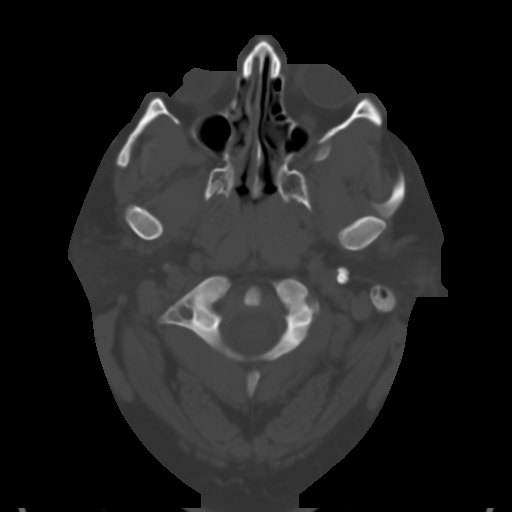
[im 4/36  brain]
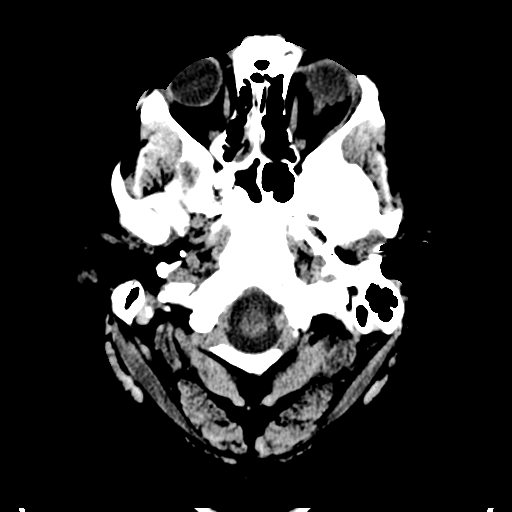
[im 7/36  brain]
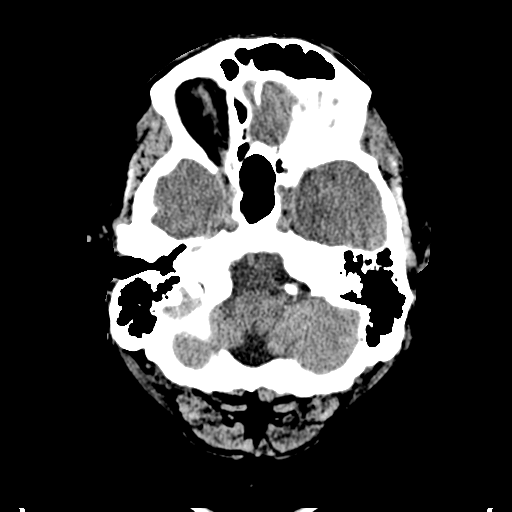
[im 9/36  brain]
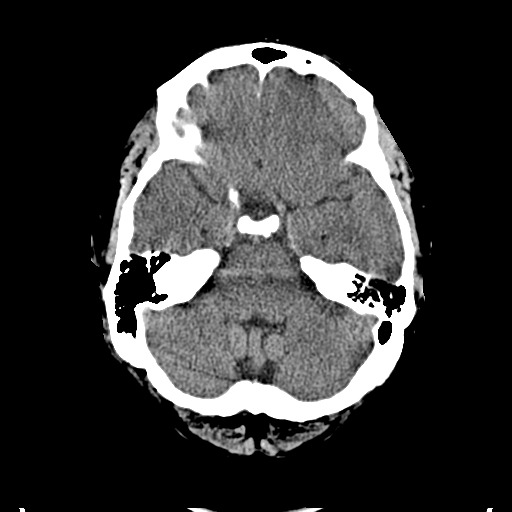
[im 10/36  brain]
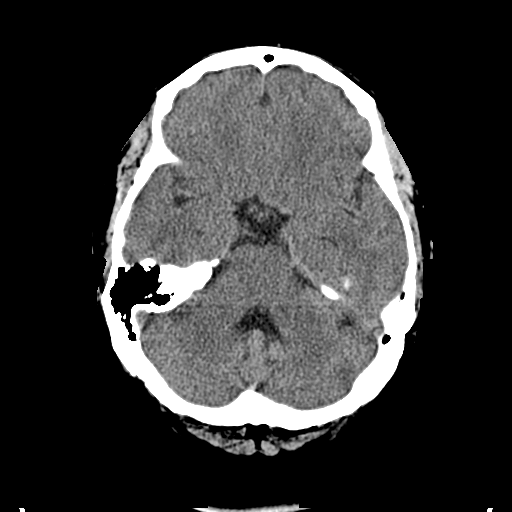
[im 10/36  bone]
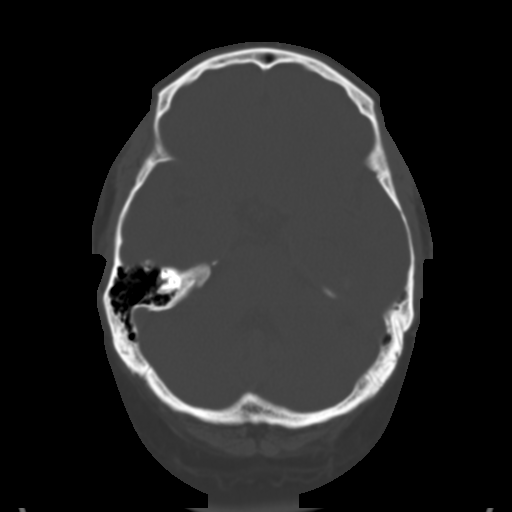
[im 13/36  brain]
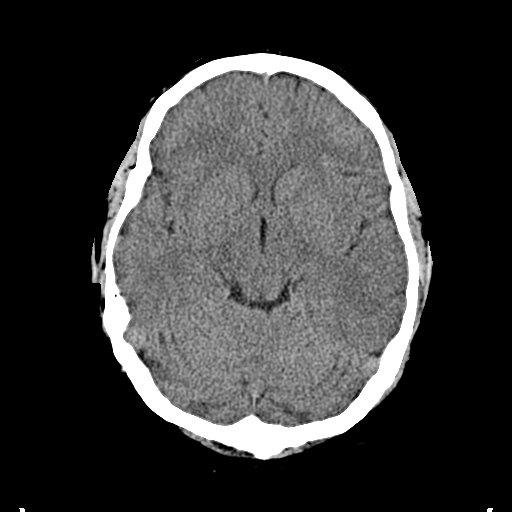
[im 15/36  brain]
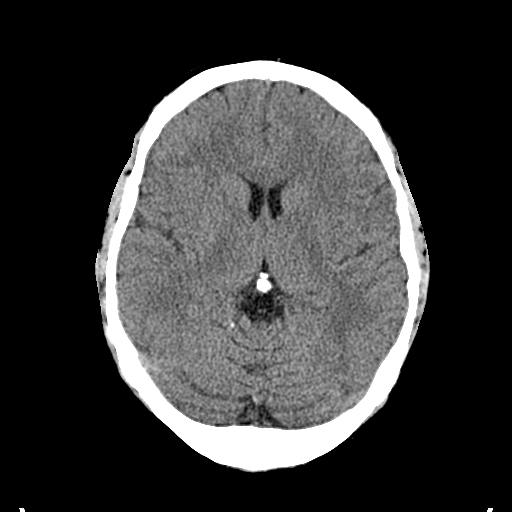
[im 17/36  brain]
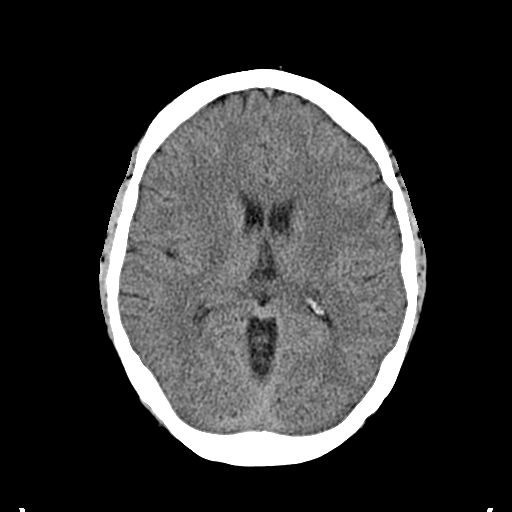
[im 19/36  brain]
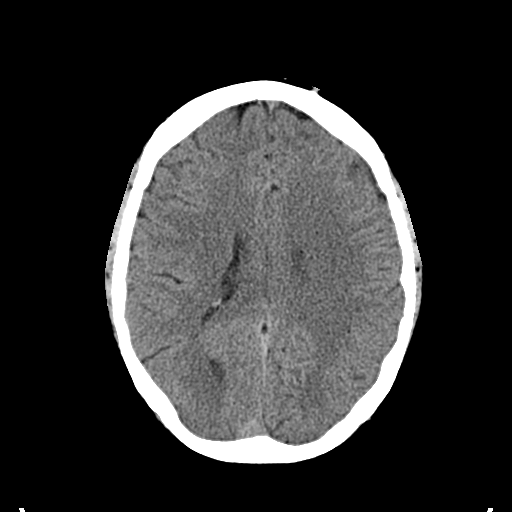
[im 19/36  bone]
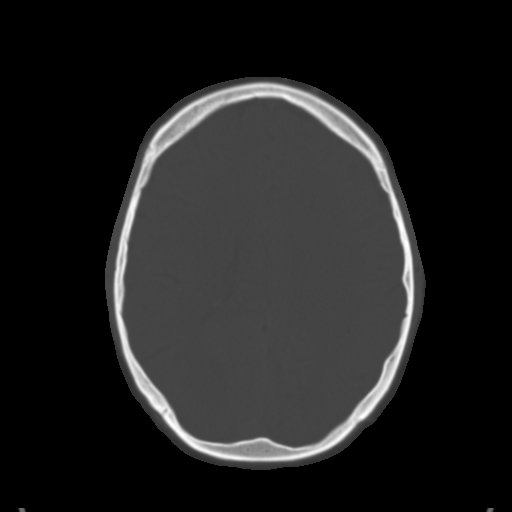
[im 21/36  brain]
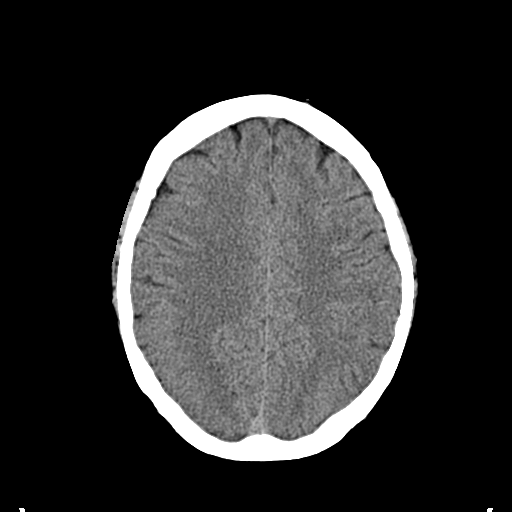
[im 23/36  brain]
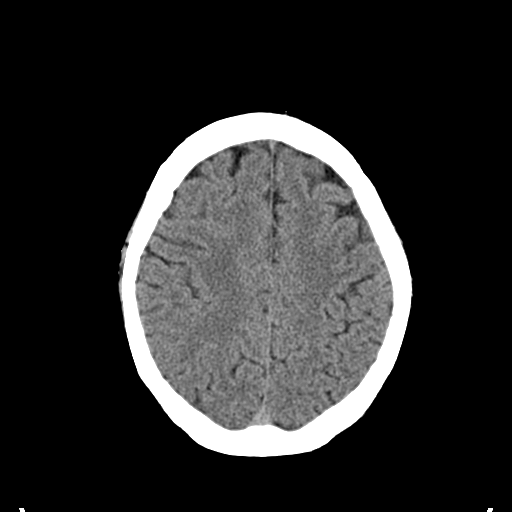
[im 26/36  brain]
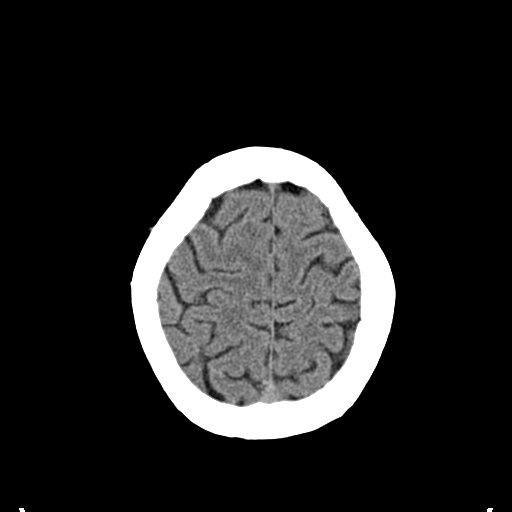
[im 27/36  brain]
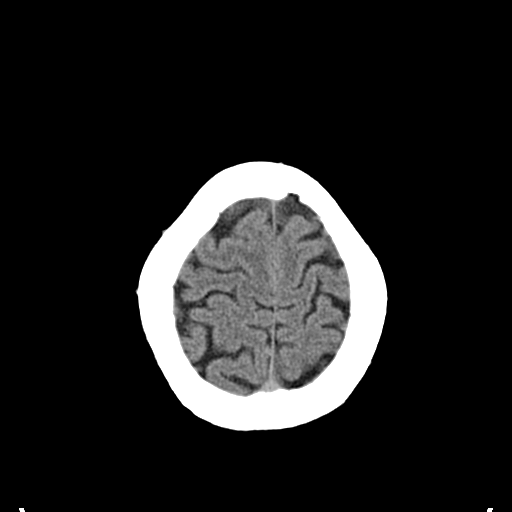
[im 27/36  bone]
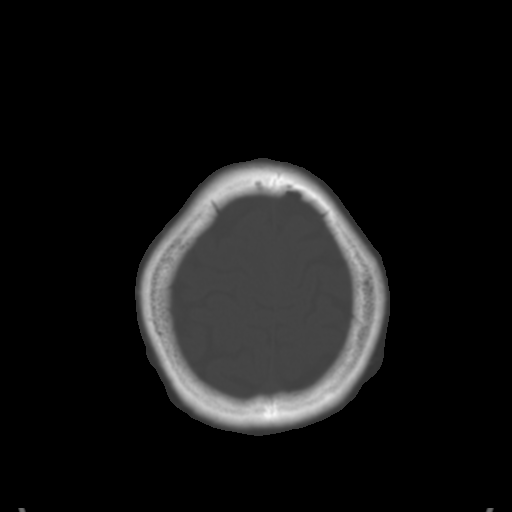
[im 29/36  brain]
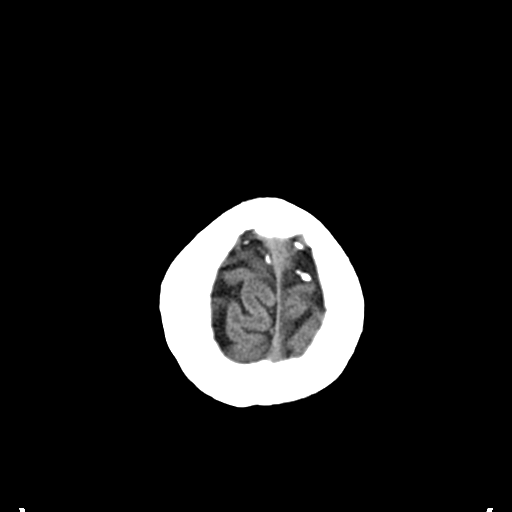
[im 32/36  brain]
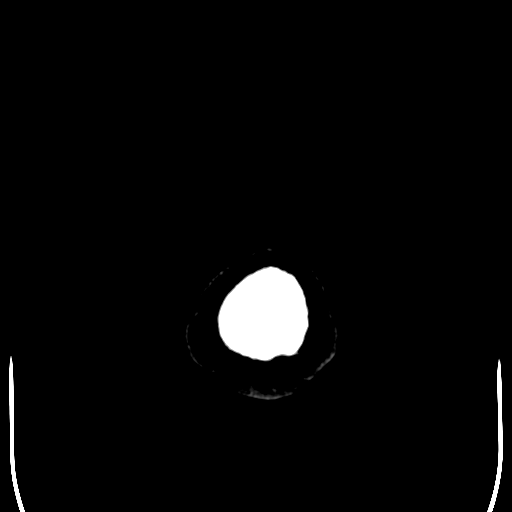
[im 34/36  brain]
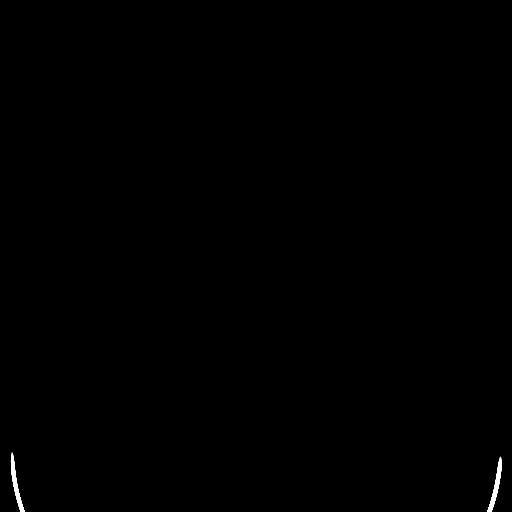

[16 of 30 positions shown; findings below may reference images not displayed]

FINDINGS: Ventricular system normal in size and appearance for age.
No mass lesion.  No midline shift.  No acute hemorrhage or
hematoma.  No extra-axial fluid collections.  No evidence of acute
infarction.  No focal brain parenchymal abnormalities.

No focal osseous abnormalities involving the skull.  Visualized
paranasal sinuses, mastoid air cells, and middle ear cavities well-
aerated.
IMPRESSION: Normal unenhanced cranial CT.

## 2010-03-21 ENCOUNTER — Telehealth: Payer: Self-pay | Admitting: Family Medicine

## 2010-03-22 ENCOUNTER — Telehealth: Payer: Self-pay | Admitting: Family Medicine

## 2010-03-26 NOTE — Progress Notes (Signed)
Summary: Prescription question  Phone Note Call from Patient Call back at [phone]   Caller: Patient Complaint: Cough/Sore throat Summary of Call: Patient has a question about the prescription he was given.   Initial call taken by: Georga Bora,  March 22, 2010 11:35 AM  Follow-up for Phone Call        Spoke with patient and clarified dosing of the nystatin I recently rx'd for him.  If foreskin fungal infection not improving in the next 3d, pt to call and I will eRx diflucan 150mg  once daily x 1d and continue the 14d course of nystatin cream. Follow-up by: Michell Heinrich M.D.,  March 22, 2010 4:47 PM

## 2010-03-26 NOTE — Progress Notes (Signed)
Summary: Pt wants to talk to doctor  Phone Note Call from Patient Call back at Home Phone [phone]   Caller: Patient Reason for Call: Talk to Doctor Summary of Call: Pt would like to talk to Dr Milinda Cave, did not share why Initial call taken by: Lannette Donath,  March 21, 2010 8:43 AM  Follow-up for Phone Call        LM to [facility] at home number Francee Piccolo CMA Duncan Dull)  March 21, 2010 9:15 AM     Additional Follow-up for Phone Call Additional follow up Details #1::        Pt. describes yeast infection symptoms in foreskin area--he is uncircumcised.  Will send eRx for nystatin cream. Additional Follow-up by: Michell Heinrich M.D.,  March 21, 2010 10:58 AM    New/Updated Medications: NYSTATIN 100000 UNIT/GM CREA (NYSTATIN) apply to affected area four times a day for 14 days Prescriptions: NYSTATIN 100000 UNIT/GM CREA (NYSTATIN) apply to affected area four times a day for 14 days  #1 tube x 1   Entered by:   Francee Piccolo CMA (AAMA)   Authorized by:   Michell Heinrich M.D.   Signed by:   Michell Heinrich M.D. on 03/21/2010   Method used:   [facility]  [place] [address]       [address]       [place], [place]  [address]       Ph: [phone] or [phone]       Fax: [phone]   RxID:   [id number]

## 2010-04-04 ENCOUNTER — Encounter: Payer: Self-pay | Admitting: Family Medicine

## 2010-04-04 ENCOUNTER — Telehealth: Payer: Self-pay | Admitting: Family Medicine

## 2010-04-11 ENCOUNTER — Ambulatory Visit (INDEPENDENT_AMBULATORY_CARE_PROVIDER_SITE_OTHER): Payer: Commercial Managed Care - PPO | Admitting: Family Medicine

## 2010-04-11 ENCOUNTER — Encounter: Payer: Self-pay | Admitting: Family Medicine

## 2010-04-11 DIAGNOSIS — D409 Neoplasm of uncertain behavior of male genital organ, unspecified: Secondary | ICD-10-CM | POA: Insufficient documentation

## 2010-04-11 DIAGNOSIS — B3749 Other urogenital candidiasis: Secondary | ICD-10-CM | POA: Insufficient documentation

## 2010-04-16 NOTE — Assessment & Plan Note (Signed)
Summary: fu phone call/dt   Vital Signs:  Patient profile:   35 year old [name] Height:      71 inches Weight:      323 pounds BMI:     45.21 Pulse rate:   65 / minute BP sitting:   136 / 89  (right arm) Cuff size:   large  Vitals Entered By: Francee Piccolo CMA Duncan Dull) (April 11, 2010 9:14 AM) CC: follow-up visit--SP Is Patient Diabetic? No   History of Present Illness: Chad [name] here for f/u of rash on penis. We've been treating him for presumed yeast balanitis that came up after a course of amoxil for strep throat.  He had some redness and discomfort that waxed and waned with nystatin two times a day --not qid b/c of small amt dispensed from pharm---but ultimately we had to give a 3d course of diflucan to get full clearance.  Currently he feels like this has cleared up.  However, he is concerned about a "bump" on penile shaft that showed up about the same time as his yeast balanitis symptoms.  This bump has not hurt or bled or oozed anything.  No itching. His fiance has no GU lesions.  Current Medications (verified): 1)  Multivitamins  Tabs (Multiple Vitamin) .... Take 1 Tablet By Mouth Once A Day  Allergies (verified): 1)  ! Ibuprofen 2)  ! * Aleve 3)  ! * Excedrin 4)  ! Jonne Ply  Past History:  Past Medical History: Reviewed history from 03/07/2010 and no changes required. Morbid obesity Insulin resistance (HbA1c 6.3 % 10/2009) Transaminasemia (presumed fatty liver) 10/2009 Hypertriglyceridemia (mild) 10/2009  Past Surgical History: Reviewed history from 05/01/2008 and no changes required. Denies surgical history  Social History: Reviewed history from 03/07/2010 and no changes required. Engaged, has 108 y/o son, lives in [place], [place].  Originally from [place], [place]. 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    Review of Systems       No fevers, wt loss, or malaise/fatigue.  No penile d/c, no dysuria or  hematuria.  Physical Exam  General:  GU: Uncircumcised penis.  Foreskin retractable without problem.  No rash or tenderness. About midway down shaft on right side there is a 1 mm dome shaped papule with slight irregularity of surface but no distinct central umbillication and no distinct condylomatous characteristics.   Impression & Recommendations:  Problem # 1:  PENILE LESION (ICD-236.6) Assessment New I think this is a molluscum lesion but could also be small condyloma. Will do trial of aldara cream three nights per week for 8 wks. Recheck in office in 6 wks.  Problem # 2:  YEAST BALANITIS (ICD-112.2) Assessment: Improved Resolved.  Complete Medication List: 1)  Multivitamins Tabs (Multiple vitamin) .... Take 1 tablet by mouth once a day 2)  Aldara 5 % Crea (Imiquimod) .... Apply to affected area three times per week for 8 weeks  Patient Instructions: 1)  F/u 6 wks. Prescriptions: ALDARA 5 % CREA (IMIQUIMOD) Apply to affected area three times per week for 8 weeks  #1 mo supply x 1   Entered and Authorized by:   Michell Heinrich M.D.   Signed by:   Michell Heinrich M.D. on 04/11/2010   Method used:   [facility]  [address]       [address]  [place], [place]  [address]       Ph: [phone] or [phone]       Fax: [phone]   RxID:   [id number]    Orders Added: 1)  Est. Patient Level III [27253]

## 2010-04-16 NOTE — Progress Notes (Signed)
Summary: Patient call back  Phone Note Call from Patient Call back at Home Phone [phone]   Caller: Patient Reason for Call: Talk to Doctor Summary of Call: Pt wants to discuss his "previous condition" Initial call taken by: Lannette Donath,  April 04, 2010 9:44 AM  Follow-up for Phone Call        pt still has discharge.  He only received a small tube of cream that was not enough for 14 day treatment.  Would like refill. Follow-up by: Francee Piccolo CMA Duncan Dull),  April 04, 2010 10:51 AM  Additional Follow-up for Phone Call Additional follow up Details #1::        The nystatin rx I did for him already has one RF....did he already fill that, too? I will do eRx for a 3 day course of diflucan to see if we can clear it up better/quicker that way.  Additional Follow-up by: Michell Heinrich M.D.,  April 04, 2010 10:57 AM    Additional Follow-up for Phone Call Additional follow up Details #2::    Pls call pt back at [phone], he has scheduled an 8:45 appt for tomorrow 04/11/10 but would like to talk to you first Diane Tomerlin  April 10, 2010 11:05 AM  Additional Follow-up for Phone Call Additional follow up Details #3:: Details for Additional Follow-up Action Taken: I will discuss his problem with him at his visit tomorrow. Additional Follow-up by: Michell Heinrich M.D.,  April 10, 2010 12:14 PM  Prescriptions: FLUCONAZOLE 150 MG TABS (FLUCONAZOLE) 1 tab by mouth once daily x 3d  #3 x 0   Entered and Authorized by:   Michell Heinrich M.D.   Signed by:   Michell Heinrich M.D. on 04/04/2010   Method used:   [facility]  [place] [address]       [address]       [place], [place]  [address]       Ph: [phone] or [phone]       Fax: [phone]   RxID:   [id number]  I spoke to pt to follow up on his condition.  Pt states things almost cleared up and then came back last night.  Advised pt to come in for visit to discuss  questions he has with the doctor and also for a re-check.  Apologized for delay in return call.  Pt understanding and will come to appt tomorrow.  Francee Piccolo CMA Duncan Dull)  April 10, 2010 4:46 PM

## 2010-04-25 ENCOUNTER — Other Ambulatory Visit: Payer: Self-pay | Admitting: Family Medicine

## 2010-04-25 DIAGNOSIS — Z7251 High risk heterosexual behavior: Secondary | ICD-10-CM

## 2010-04-26 ENCOUNTER — Other Ambulatory Visit: Payer: Commercial Managed Care - PPO

## 2010-04-26 DIAGNOSIS — Z7251 High risk heterosexual behavior: Secondary | ICD-10-CM

## 2010-04-27 LAB — GC/CHLAMYDIA PROBE AMP, URINE: GC Probe Amp, Urine: NEGATIVE

## 2010-04-27 LAB — HIV ANTIBODY (ROUTINE TESTING W REFLEX): HIV: NONREACTIVE

## 2010-04-29 ENCOUNTER — Telehealth: Payer: Self-pay | Admitting: *Deleted

## 2010-04-29 NOTE — Telephone Encounter (Signed)
I spoke to pt's fiance, and advised of lab results per DPR.  She will call pt with these results.  I am unsure if patient will return my call.  Note left open.

## 2010-04-29 NOTE — Telephone Encounter (Signed)
I have attempted to contact this patient by phone with the following results: left message to return my call on answering machine.

## 2010-04-29 NOTE — Telephone Encounter (Signed)
Message copied by Francee Piccolo on Mon Apr 29, 2010 11:59 AM ------      Message from: Nicoletta Ba      Created: Mon Apr 29, 2010  8:28 AM       Please notify: all labs came back normal.

## 2010-04-30 NOTE — Telephone Encounter (Signed)
I spoke to pt's fiance again today.  She has notified pt of results.  I advised her to let him know to call our office if he has any questions or concerns.  She is agreeable.  Note closed.

## 2011-04-07 ENCOUNTER — Encounter: Payer: Self-pay | Admitting: Family Medicine

## 2011-04-07 ENCOUNTER — Ambulatory Visit (INDEPENDENT_AMBULATORY_CARE_PROVIDER_SITE_OTHER): Payer: Commercial Managed Care - PPO | Admitting: Family Medicine

## 2011-04-07 VITALS — BP 144/95 | HR 80 | Temp 98.5°F | Wt 338.0 lb

## 2011-04-07 DIAGNOSIS — K5289 Other specified noninfective gastroenteritis and colitis: Secondary | ICD-10-CM

## 2011-04-07 DIAGNOSIS — K529 Noninfective gastroenteritis and colitis, unspecified: Secondary | ICD-10-CM

## 2011-04-07 NOTE — Progress Notes (Signed)
OFFICE NOTE  04/07/2011  CC:  Chief Complaint  Patient presents with  . Diarrhea    better after Immodium AD, bloody/mucous stool, fever/chills, cramping, hx of hemorrhoids also     HPI: Patient is a 36 y.o. Hispanic male who is here for diarrhea. Onset 2-3 nights ago, abdominal cramping, lots of loose BMs, described as mucousy at first, but lately just small amounts of brownish liquid.  No f/c.  No n/v.  Appetite has been fine. Ate pizza the night of onset, a few friends who at same pizza also have had GI illness after. Has hx of external hemorrhoids, says the recent increased stooling has aggravated them some and he has had some scant BRBPR.   Fluid intake is good.  Pertinent PMH:  Past Medical History  Diagnosis Date  . Obesities, morbid   . Insulin resistance 10/2009    HbA1C 6.3%  . Transaminasemia 10/2009    presumed fatty liver  . Hypertriglyceridemia 10/2009    mild   Past surgical, social, and family history reviewed and no changes noted since last office visit.  MEDS:  Outpatient Prescriptions Prior to Visit  Medication Sig Dispense Refill  . Multiple Vitamin (MULTIVITAMIN) tablet Take 1 tablet by mouth daily.          PE: Blood pressure 144/95, pulse 80, temperature 98.5 F (36.9 C), temperature source Temporal, weight 338 lb (153.316 kg). Gen: Alert, well appearing.  Patient is oriented to person, place, time, and situation. ENT:  Eyes: no injection, icteris, swelling, or exudate.  EOMI, PERRLA. Nose: no drainage or turbinate edema/swelling.  No injection or focal lesion.  Mouth: lips without lesion/swelling.  Oral mucosa pink and moist.  Dentition intact and without obvious caries or gingival swelling.  Oropharynx without erythema, exudate, or swelling.  Neck - No masses or thyromegaly or limitation in range of motion CV: RRR, no m/r/g.   LUNGS: CTA bilat, nonlabored resps, good aeration in all lung fields. ABD: soft, NT, ND, BS normal.  No hepatospenomegaly  or mass.  No bruits. EXT: no clubbing, cyanosis, or edema.    IMPRESSION AND PLAN: Gastroenteritis Self-limited nature of this illness was discussed, questions answered.  Discussed symptomatic care, rest, fluids.   Warning signs/symptoms of worsening illness were discussed.  Patient instructed to call or return if any of these occur. If not significantly improved in 48h will proceed with stool evaluation +/- revisit.      FOLLOW UP: prn

## 2011-04-08 DIAGNOSIS — K529 Noninfective gastroenteritis and colitis, unspecified: Secondary | ICD-10-CM | POA: Insufficient documentation

## 2011-04-08 NOTE — Assessment & Plan Note (Signed)
Self-limited nature of this illness was discussed, questions answered.  Discussed symptomatic care, rest, fluids.   Warning signs/symptoms of worsening illness were discussed.  Patient instructed to call or return if any of these occur. If not significantly improved in 48h will proceed with stool evaluation +/- revisit.

## 2011-07-15 ENCOUNTER — Ambulatory Visit (INDEPENDENT_AMBULATORY_CARE_PROVIDER_SITE_OTHER): Payer: Commercial Managed Care - PPO | Admitting: Family Medicine

## 2011-07-15 ENCOUNTER — Encounter: Payer: Self-pay | Admitting: Family Medicine

## 2011-07-15 VITALS — BP 146/89 | HR 66 | Ht 71.0 in | Wt 350.0 lb

## 2011-07-15 DIAGNOSIS — Z0000 Encounter for general adult medical examination without abnormal findings: Secondary | ICD-10-CM

## 2011-07-15 DIAGNOSIS — R030 Elevated blood-pressure reading, without diagnosis of hypertension: Secondary | ICD-10-CM

## 2011-07-15 DIAGNOSIS — E669 Obesity, unspecified: Secondary | ICD-10-CM

## 2011-07-15 DIAGNOSIS — E66812 Obesity, class 2: Secondary | ICD-10-CM

## 2011-07-15 DIAGNOSIS — D72829 Elevated white blood cell count, unspecified: Secondary | ICD-10-CM

## 2011-07-15 DIAGNOSIS — E785 Hyperlipidemia, unspecified: Secondary | ICD-10-CM

## 2011-07-15 NOTE — Assessment & Plan Note (Signed)
Reviewed age and gender appropriate health maintenance issues (prudent diet, regular exercise, health risks of tobacco and excessive alcohol, use of seatbelts, fire alarms in home, use of sunscreen).  Also reviewed age and gender appropriate health screening as well as vaccine recommendations. 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 Regarding his overall health, his obesity is undoubtedly his biggest issue and he knows this and plans on trying to make some lifestyle mod again soon (with his wife).  Will f/u in 110mo and he'll get fasting labs the week prior to this visit.

## 2011-07-15 NOTE — Progress Notes (Signed)
Office Note 07/15/2011  CC:  Chief Complaint  Patient presents with  . Annual Exam    BSA physical form    HPI:  Chad [name] is a 36 y.o. White male who is here for complete physical/health maintenance exam. 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  He feels good and has no acute complaints.   Past Medical History  Diagnosis Date  . Obesities, morbid   . Insulin resistance 10/2009    HbA1C 6.3%  . Transaminasemia 10/2009    presumed fatty liver  . Hypertriglyceridemia 10/2009    mild    History reviewed. No pertinent past surgical history.  History reviewed. No pertinent family history.  History   Social History  . Marital Status: Significant Other    Spouse Name: N/A    Number of Children: 1  . 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 Smoking status: Unknown If Ever Smoked  . Smokeless tobacco: Never Used  . Alcohol Use: Yes     occ beer  . Drug Use: No  . Sexually Active: Not on file   Other Topics Concern  . Not on file   Social History Narrative  . No narrative on file    Outpatient Prescriptions Prior to Visit  Medication Sig Dispense Refill  . Multiple Vitamin (MULTIVITAMIN) tablet Take 1 tablet by mouth daily.          Allergies  Allergen Reactions  . Aspirin   . Aspirin-Acetaminophen-Caffeine   . Ibuprofen   . Naproxen Sodium     ROS Review of Systems  Constitutional: Negative for fever, chills, appetite change and fatigue.  HENT: Negative for ear pain, congestion, sore throat, neck stiffness and dental problem.   Eyes: Negative for discharge, redness and visual disturbance.  Respiratory: Negative for cough, chest tightness, shortness of breath and wheezing.   Cardiovascular: Negative for chest pain, palpitations and leg swelling.    Gastrointestinal: Negative for nausea, vomiting, abdominal pain, diarrhea and blood in stool.  Genitourinary: Negative for dysuria, urgency, frequency, hematuria, flank pain and difficulty urinating.  Musculoskeletal: Negative for myalgias, back pain, joint swelling and arthralgias.  Skin: Negative for pallor and rash.  Neurological: Negative for dizziness, speech difficulty, weakness and headaches.  Hematological: Negative for adenopathy. Does not bruise/bleed easily.  Psychiatric/Behavioral: Negative for confusion and disturbed wake/sleep cycle. The patient is not nervous/anxious.     PE; Blood pressure 146/89, pulse 66, height 5\' 11"  (1.803 m), weight 350 lb (158.759 kg). Gen: Alert, well appearing, obese white male.  Patient is oriented to person, place, time, and situation. Psych: affect pleasant, thought and speech lucid ENT: Ears: EACs clear, normal epithelium.  TMs with good light reflex and landmarks bilaterally.  Eyes: no injection, icteris, swelling, or exudate.  EOMI, PERRLA. Nose: no drainage or turbinate edema/swelling.  No injection or focal lesion.  Mouth: lips without lesion/swelling.  Oral mucosa pink and moist.  Dentition intact and without obvious caries or gingival swelling.  Oropharynx without erythema, exudate, or swelling.  Neck: supple/nontender.  No LAD, mass, or TM.  Carotid pulses 2+ bilaterally, without bruits. CV: RRR, no m/r/g.   LUNGS: CTA bilat, nonlabored resps, good aeration in all lung fields. ABD: soft, NT, rotund but ND, BS normal.  No hepatospenomegaly or mass.  No bruits. EXT: no clubbing, cyanosis, or edema.  Musculoskeletal: no joint swelling, erythema, warmth, or tenderness.  ROM of all joints intact. Skin - no sores or suspicious lesions or rashes or color changes Genitals normal; both testes normal without tenderness, masses, hydroceles, varicoceles, erythema or swelling. Shaft normal, uncircumcised, meatus normal without discharge. No inguinal  hernia noted. No inguinal lymphadenopathy.  Pertinent labs:  None today  ASSESSMENT AND PLAN:   Health maintenance examination Reviewed age and gender appropriate health maintenance issues (prudent diet, regular exercise, health risks of tobacco and excessive alcohol, use of seatbelts, fire alarms in home, use of sunscreen).  Also reviewed age and gender appropriate health screening as well as vaccine recommendations. 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 Regarding his overall health, his obesity is undoubtedly his biggest issue and he knows this and plans on trying to make some lifestyle mod again soon (with his wife).  Will f/u in 66mo and he'll get fasting labs the week prior to this visit.   Need to offer/recommend Tdap at next f/u visit.  FOLLOW UP:  Return in about 4 months (around 11/14/2011) for f/u obesity and needs fasting labs the week prior (entered already).

## 2011-08-11 ENCOUNTER — Ambulatory Visit (INDEPENDENT_AMBULATORY_CARE_PROVIDER_SITE_OTHER): Payer: Commercial Managed Care - PPO | Admitting: Family Medicine

## 2011-08-11 ENCOUNTER — Encounter: Payer: Self-pay | Admitting: Family Medicine

## 2011-08-11 VITALS — BP 135/87 | HR 69 | Ht 71.0 in | Wt 352.0 lb

## 2011-08-11 DIAGNOSIS — B081 Molluscum contagiosum: Secondary | ICD-10-CM

## 2011-08-11 MED ORDER — MUPIROCIN 2 % EX OINT: TOPICAL_OINTMENT | CUTANEOUS | Status: DC

## 2011-08-11 NOTE — Assessment & Plan Note (Signed)
Reassured pt of dx, self limited nature of this lesion. I do think it is more irritated than infected-appearing, but I did rx bactroban ointment and recommended tid application to the lesion x 10d. Cover with band-aid.  Gave pt educ info on molluscum contagiosum and went over this with him.

## 2011-08-11 NOTE — Patient Instructions (Addendum)
Molluscum Contagiosum Molluscum contagiosum is a viral infection of the skin that causes smooth surfaced, firm, small (3 to 5 mm), dome-shaped bumps (papules) which are flesh-colored. The bumps usually do not hurt or itch. In children, they most often appear on the face, trunk, arms and legs. In adults, the growths are commonly found on the genitals, thighs, face, neck, and belly (abdomen). The infection may be spread to others by close (skin to skin) contact (such as occurs in schools and swimming pools), sharing towels and clothing, and through sexual contact. The bumps usually disappear without treatment in 2 to 4 months, especially in children. You may have them treated to avoid spreading them. Scraping (curetting) the middle part (central plug) of the bump with a needle or sharp curette, or application of liquid nitrogen for 8 or 9 seconds usually cures the infection. HOME CARE INSTRUCTIONS   Do not scratch the bumps. This may spread the infection to other parts of the body and to other people.   Avoid close contact with others, including sexual contact, until the bumps disappear. Do not share towels or clothing.   If liquid nitrogen was used, blisters will form. Leave the blisters alone and cover with a bandage. The tops will fall off by themselves in 7 to 14 days.   Four months without a lesion is usually a cure.  SEEK IMMEDIATE MEDICAL CARE IF:  You have a fever.   You develop swelling, redness, pain, tenderness, or warmth in the areas of the bumps. They may be infected.  Document Released: 01/11/2000 Document Revised: 01/02/2011 Document Reviewed: 06/23/2008 Exit[employer] Patient Information 2012 [employer]. 

## 2011-08-11 NOTE — Progress Notes (Signed)
OFFICE NOTE  08/11/2011  CC:  Chief Complaint  Patient presents with  . acute    ingrown hair     HPI: Patient is a 36 y.o. Caucasian male who is here for ingrown hair. Started as little bump on chest about 10d/a, little depression/pit in middle of it.  His wife squeezed it and a small amount of white substance came out when she did this (1-2 days ago).  Wants to get it checked for infection.  Pertinent PMH:  Past Medical History  Diagnosis Date  . Obesities, morbid   . Insulin resistance 10/2009    HbA1C 6.3%  . Transaminasemia 10/2009    presumed fatty liver  . Hypertriglyceridemia 10/2009    mild    MEDS:  Outpatient Prescriptions Prior to Visit  Medication Sig Dispense Refill  . Multiple Vitamin (MULTIVITAMIN) tablet Take 1 tablet by mouth daily.          PE: Blood pressure 135/87, pulse 69, height 5\' 11"  (1.803 m), weight 352 lb (159.666 kg). Gen: Alert, well appearing.  Patient is oriented to person, place, time, and situation. SKIN: central chest with 2-3 mm oval, crusted dome-shaped papule with a cm or so of surrounding erythema.  No tenderness, warmth, or discharge.  IMPRESSION AND PLAN:  Molluscum contagiosum Reassured pt of dx, self limited nature of this lesion. I do think it is more irritated than infected-appearing, but I did rx bactroban ointment and recommended tid application to the lesion x 10d. Cover with band-aid.  Gave pt educ info on molluscum contagiosum and went over this with him.     FOLLOW UP: prn

## 2011-11-07 ENCOUNTER — Other Ambulatory Visit: Payer: Commercial Managed Care - PPO

## 2011-11-14 ENCOUNTER — Encounter: Payer: Commercial Managed Care - PPO | Admitting: Family Medicine

## 2011-11-14 DIAGNOSIS — Z0289 Encounter for other administrative examinations: Secondary | ICD-10-CM

## 2011-12-31 ENCOUNTER — Encounter: Payer: Self-pay | Admitting: Family Medicine

## 2011-12-31 ENCOUNTER — Ambulatory Visit (INDEPENDENT_AMBULATORY_CARE_PROVIDER_SITE_OTHER): Payer: Commercial Managed Care - PPO | Admitting: Family Medicine

## 2011-12-31 VITALS — BP 148/88 | HR 86 | Temp 98.0°F | Ht 71.0 in | Wt 355.1 lb

## 2011-12-31 DIAGNOSIS — J029 Acute pharyngitis, unspecified: Secondary | ICD-10-CM | POA: Insufficient documentation

## 2011-12-31 DIAGNOSIS — IMO0001 Reserved for inherently not codable concepts without codable children: Secondary | ICD-10-CM

## 2011-12-31 DIAGNOSIS — R030 Elevated blood-pressure reading, without diagnosis of hypertension: Secondary | ICD-10-CM

## 2011-12-31 LAB — POCT RAPID STREP A (OFFICE): Rapid Strep A Screen: NEGATIVE

## 2011-12-31 MED ORDER — PROBIOTIC PRODUCT PO CHEW: CHEWABLE_TABLET | ORAL | Status: DC

## 2011-12-31 MED ORDER — AMOXICILLIN 500 MG PO CAPS: 500.0000 mg | ORAL_CAPSULE | Freq: Three times a day (TID) | ORAL | Status: DC

## 2011-12-31 NOTE — Assessment & Plan Note (Addendum)
Amoxicillin if symptoms worsen and start a probiotic, increase rest and hydration, rapid strep negative, sent for confirmation. Family member tested positive today

## 2011-12-31 NOTE — Assessment & Plan Note (Signed)
Elevated with repeat check, encouraged DASH diet and increased activity then return in 3 months for labwork and repeat check

## 2011-12-31 NOTE — Patient Instructions (Addendum)

## 2011-12-31 NOTE — Progress Notes (Signed)
Patient ID: Chad [name], male   DOB: [birth year], 36 y.o.   MRN: [id number] Chad [name] [id number] Chad [name] Chad [name]      Chad [name]  Subjective  Chief Complaint  Chief Complaint  Patient presents with  . Sore Throat    HPI  Patient is a 36 year old male in today for irritation in his throat. His wife has tested positive for strep throat earlier today. He does notice an intermittent headache over the last few days and some mild discomfort in his throat when he falls for several days. He's had some mild loose stool couple times a day for the last 2 days as well a low-grade nausea. Denies any head congestion, fevers, chills, chest pain or palpitations, shortness of breath or other complaints at today's visit  Past Medical History  Diagnosis Date  . Obesities, morbid   . Insulin resistance 10/2009    HbA1C 6.3%  . Transaminasemia 10/2009    presumed fatty liver  . Hypertriglyceridemia 10/2009    mild  . Sore throat Chad [name]  . Elevated BP Chad [name]    History reviewed. No pertinent past surgical history.  History reviewed. No pertinent family history.  History   Social History  . Marital Status: Significant Other    Spouse Name: N/A    Number of Children: 1  . 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 Smoking status: Former Smoker -- 0.3 packs/day for 8 years    Types: Cigarettes  . Smokeless tobacco: Not on file  . Alcohol Use: Yes     Comment: occ beer  . Drug Use: No  . Sexually Active: Not on file   Other Topics Concern  . Not on file   Social History Narrative  . No narrative on file    Current Outpatient Prescriptions on File Prior to Visit  Medication Sig Dispense Refill  . Multiple Vitamin (MULTIVITAMIN) tablet Take 1 tablet by mouth daily.          Allergies  Allergen Reactions  . Aspirin   . Aspirin-Acetaminophen-Caffeine   . Ibuprofen   . Naproxen  Sodium     Review of Systems  Review of Systems  Constitutional: Negative for fever and malaise/fatigue.  HENT: Positive for sore throat. Negative for congestion.   Eyes: Negative for discharge.  Respiratory: Negative for shortness of breath.   Cardiovascular: Negative for chest pain, palpitations and leg swelling.  Gastrointestinal: Positive for nausea. Negative for abdominal pain and diarrhea.  Genitourinary: Negative for dysuria.  Musculoskeletal: Negative for falls.  Skin: Negative for rash.  Neurological: Positive for headaches. Negative for loss of consciousness.  Endo/Heme/Allergies: Negative for polydipsia.  Psychiatric/Behavioral: Negative for depression and suicidal ideas. The patient is not nervous/anxious and does not have insomnia.     Objective  BP 148/88  Pulse 86  Temp 98 F (36.7 C) (Temporal)  Ht 5\' 11"  (1.803 m)  Wt 355 lb 1.9 oz (161.081 kg)  BMI 49.53 kg/m2  SpO2 96%  Physical Exam  Physical Exam  Constitutional: He is oriented to person, place, and time and well-developed, well-nourished, and in no distress. No distress.  HENT:  Head: Normocephalic and atraumatic.       Oropharynx erythematous and slightly swollen  Eyes: Conjunctivae normal are normal.  Neck: Neck supple. No thyromegaly present.  Cardiovascular: Normal rate, regular rhythm and normal heart sounds.   No murmur heard. Pulmonary/Chest: Effort  normal and breath sounds normal. No respiratory distress.  Abdominal: He exhibits no distension and no mass. There is no tenderness.  Musculoskeletal: He exhibits no edema.  Neurological: He is alert and oriented to person, place, and time.  Skin: Skin is warm.  Psychiatric: Memory, affect and judgment normal.    Lab Results  Component Value Date   TSH 2.21 11/07/2009   Lab Results  Component Value Date   WBC 16.5* 03/07/2010   HGB 13.9 03/07/2010   HCT 41.1 03/07/2010   MCV 84.9 03/07/2010   PLT 239 03/07/2010   Lab Results  Component  Value Date   CREATININE 0.83 03/07/2010   BUN 14 03/07/2010   NA 141 03/07/2010   K 4.0 03/07/2010   CL 105 03/07/2010   CO2 25 03/07/2010   Lab Results  Component Value Date   ALT 19 03/07/2010   AST 13 03/07/2010   ALKPHOS 92 03/07/2010   BILITOT 0.7 03/07/2010   Lab Results  Component Value Date   CHOL 186 03/07/2010   Lab Results  Component Value Date   HDL 54 03/07/2010   Lab Results  Component Value Date   LDLCALC 109* 03/07/2010   Lab Results  Component Value Date   TRIG 113 03/07/2010   Lab Results  Component Value Date   CHOLHDL 3.4 Ratio 03/07/2010     Assessment & Plan  Sore throat Amoxicillin if symptoms worsen and start a probiotic, increase rest and hydration, rapid strep negative, sent for confirmation. Family member tested positive today  Elevated BP Elevated with repeat check, encouraged DASH diet and increased activity then return in 3 months for labwork and repeat check

## 2012-01-03 LAB — CULTURE, GROUP A STREP

## 2012-01-06 ENCOUNTER — Telehealth: Payer: Self-pay | Admitting: Family Medicine

## 2012-01-06 NOTE — Telephone Encounter (Signed)
Caller: Rachit/Patient; Phone: [phone]; Reason for Call: He is returning a missed call from the office I looked in EMR and see they were leaving message to let him know his throat culture was negative He said his throat is feeling some better but now has some cold sxs.

## 2012-01-07 NOTE — Telephone Encounter (Signed)
Noted  

## 2012-05-17 ENCOUNTER — Encounter: Payer: Self-pay | Admitting: Emergency Medicine

## 2012-06-18 ENCOUNTER — Encounter: Payer: Commercial Managed Care - PPO | Admitting: Family Medicine

## 2012-07-15 ENCOUNTER — Other Ambulatory Visit: Payer: Commercial Managed Care - PPO

## 2012-07-22 ENCOUNTER — Encounter: Payer: Commercial Managed Care - PPO | Admitting: Family Medicine

## 2012-07-26 ENCOUNTER — Other Ambulatory Visit: Payer: Commercial Managed Care - PPO

## 2012-08-02 ENCOUNTER — Encounter: Payer: Commercial Managed Care - PPO | Admitting: Family Medicine

## 2012-08-16 ENCOUNTER — Encounter: Payer: Commercial Managed Care - PPO | Admitting: Family Medicine

## 2012-08-16 ENCOUNTER — Other Ambulatory Visit: Payer: Commercial Managed Care - PPO

## 2012-08-23 ENCOUNTER — Encounter: Payer: Self-pay | Admitting: Family Medicine

## 2012-08-23 ENCOUNTER — Ambulatory Visit (INDEPENDENT_AMBULATORY_CARE_PROVIDER_SITE_OTHER): Payer: Commercial Managed Care - PPO | Admitting: Family Medicine

## 2012-08-23 VITALS — BP 149/96 | HR 58 | Temp 97.8°F | Resp 18 | Ht 71.0 in | Wt 325.0 lb

## 2012-08-23 DIAGNOSIS — Z23 Encounter for immunization: Secondary | ICD-10-CM

## 2012-08-23 DIAGNOSIS — Z0000 Encounter for general adult medical examination without abnormal findings: Secondary | ICD-10-CM

## 2012-08-23 LAB — COMPREHENSIVE METABOLIC PANEL
ALT: 17 U/L (ref 0–53)
Albumin: 4.2 g/dL (ref 3.5–5.2)
BUN: 13 mg/dL (ref 6–23)
CO2: 30 mEq/L (ref 19–32)
Calcium: 9.1 mg/dL (ref 8.4–10.5)
Chloride: 104 mEq/L (ref 96–112)
Creatinine, Ser: 0.8 mg/dL (ref 0.4–1.5)
GFR: 122.55 mL/min (ref >60.00)

## 2012-08-23 LAB — CBC
HCT: 38.6 % — ABNORMAL LOW (ref 39.0–52.0)
Hemoglobin: 12.7 g/dL — ABNORMAL LOW (ref 13.0–17.0)
MCV: 84.2 fl (ref 78.0–100.0)
Platelets: 242 10*3/uL (ref 150.0–400.0)
RBC: 4.58 Mil/uL (ref 4.22–5.81)

## 2012-08-23 LAB — LIPID PANEL: HDL: 44.5 mg/dL (ref >39.00)

## 2012-08-23 LAB — TSH: TSH: 1.27 u[IU]/mL (ref 0.35–5.50)

## 2012-08-23 NOTE — Assessment & Plan Note (Addendum)
Reviewed age and gender appropriate health maintenance issues (prudent diet, regular exercise, health risks of tobacco and excessive alcohol, use of seatbelts, fire alarms in home, use of sunscreen).  Also reviewed age and gender appropriate health screening as well as vaccine recommendations. Health panel labs today. Tdap today. Discussed general healthy eating habits and the importance of combining this with CV exercise at least 3 days per week.

## 2012-08-23 NOTE — Progress Notes (Signed)
Office Note 08/23/2012  CC:  Chief Complaint  Patient presents with  . Annual Exam    HPI:  Chad [name] is a 37 y.o. White male who is here for CPE. Feeling fine. Frustrated still by his weight, although he's hanging in there and has lost about 20 lbs since I last saw him. He has some questions about the dx of fatty liver that he saw via MyChart, says he was never told anything about this dx. I reviewed his chart and it seems that I have extrapolated this dx based on past history of elevated transaminases, hx of hypertriglyceridemia, and morbid obesity.  No imaging has ever been done.     Past Medical History  Diagnosis Date  . Obesities, morbid   . Insulin resistance 10/2009    HbA1C 6.3%  . Transaminasemia 10/2009    presumed fatty liver  . Hypertriglyceridemia 10/2009    mild  . Elevated blood pressure reading without diagnosis of hypertension 12/31/2011    No past surgical history on file.  FH: Breast cancer, diabetes, HTN No FH of prostate cancer or colon cancer.  History   Social History  . Marital Status: Significant Other    Spouse Name: N/A    Number of Children: 1  . 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 Smoking status: Former Smoker -- 0.30 packs/day for 8 years    Types: Cigarettes  . Smokeless tobacco: Not on file  . Alcohol Use: Yes     Comment: occ beer  . Drug Use: No  . Sexually Active: Not on file   Other Topics Concern  . Not on file   Social History Narrative   Married, has 80 y/o son, lives in [place], [place].  Originally from [place], [place].   Alcohol use-no   Drug use-no   Exercise: intermittent, treadmill and elliptical machine.   Active in cub scouts.   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            MEDs: MVI qd  Allergies  Allergen Reactions  . Aspirin   . Aspirin-Acetaminophen-Caffeine   . Ibuprofen   . Naproxen Sodium      ROS Review of Systems  Constitutional: Negative for fever, chills, appetite change and fatigue.  HENT: Negative for ear pain, congestion, sore throat, neck stiffness and dental problem.   Eyes: Negative for discharge, redness and visual disturbance.  Respiratory: Negative for cough, chest tightness, shortness of breath and wheezing.   Cardiovascular: Negative for chest pain, palpitations and leg swelling.  Gastrointestinal: Negative for nausea, vomiting, abdominal pain, diarrhea and blood in stool.  Genitourinary: Negative for dysuria, urgency, frequency, hematuria, flank pain and difficulty urinating.  Musculoskeletal: Negative for myalgias, back pain, joint swelling and arthralgias.  Skin: Negative for pallor and rash.  Neurological: Negative for dizziness, speech difficulty, weakness and headaches.  Hematological: Negative for adenopathy. Does not bruise/bleed easily.  Psychiatric/Behavioral: Negative for confusion and sleep disturbance. The patient is not nervous/anxious.     PE; Blood pressure 149/96, pulse 58, temperature 97.8 F (36.6 C), temperature source Oral, resp. rate 18, height 5\' 11"  (1.803 m), weight 325 lb (147.419 kg), SpO2 98.00%. Gen: Alert, well appearing, obese white male in NAD.  Patient is oriented to person, place, time, and situation. AFFECT: pleasant, lucid thought and speech. ENT: Ears: EACs clear, normal epithelium.  TMs with good light reflex and landmarks  bilaterally.  Eyes: no injection, icteris, swelling, or exudate.  EOMI, PERRLA. Nose: no drainage or turbinate edema/swelling.  No injection or focal lesion.  Mouth: lips without lesion/swelling.  Oral mucosa pink and moist.  Dentition intact and without obvious caries or gingival swelling.  Oropharynx without erythema, exudate, or swelling.  Neck: supple/nontender.  No LAD, mass, or TM.   CV: RRR, no m/r/g.   LUNGS: CTA bilat, nonlabored resps, good aeration in all lung fields. ABD: soft, NT, ND, BS  normal.  No hepatospenomegaly or mass.  No bruits. EXT: no clubbing, cyanosis, or edema.  Musculoskeletal: no joint swelling, erythema, warmth, or tenderness.  ROM of all joints intact. Skin - no sores or suspicious lesions or rashes or color changes  Pertinent labs:  None today  ASSESSMENT AND PLAN:   Health maintenance examination Reviewed age and gender appropriate health maintenance issues (prudent diet, regular exercise, health risks of tobacco and excessive alcohol, use of seatbelts, fire alarms in home, use of sunscreen).  Also reviewed age and gender appropriate health screening as well as vaccine recommendations. Health panel labs today. Tdap today. Discussed general healthy eating habits and the importance of combining this with CV exercise at least 3 days per week.   An After Visit Summary was printed and given to the patient.  FOLLOW UP:  Return in about 1 year (around 08/23/2013) for Cpe.

## 2012-08-24 ENCOUNTER — Ambulatory Visit: Payer: Commercial Managed Care - PPO

## 2012-08-24 DIAGNOSIS — D649 Anemia, unspecified: Secondary | ICD-10-CM

## 2012-08-25 ENCOUNTER — Encounter: Payer: Self-pay | Admitting: Family Medicine

## 2012-08-27 ENCOUNTER — Other Ambulatory Visit: Payer: Self-pay | Admitting: Family Medicine

## 2012-08-27 DIAGNOSIS — D649 Anemia, unspecified: Secondary | ICD-10-CM

## 2012-12-02 ENCOUNTER — Other Ambulatory Visit: Payer: Self-pay

## 2013-01-17 ENCOUNTER — Telehealth: Payer: Self-pay | Admitting: Family Medicine

## 2013-01-17 NOTE — Telephone Encounter (Signed)
Patient has an issue that he would like to discuss with Dr. Milinda Cave, requesting call back before scheduling an appointment

## 2013-01-18 ENCOUNTER — Encounter: Payer: Self-pay | Admitting: Nurse Practitioner

## 2013-01-18 ENCOUNTER — Ambulatory Visit (INDEPENDENT_AMBULATORY_CARE_PROVIDER_SITE_OTHER): Payer: Commercial Managed Care - PPO | Admitting: Nurse Practitioner

## 2013-01-18 VITALS — BP 130/84 | HR 67 | Temp 97.4°F | Ht 71.0 in | Wt 341.0 lb

## 2013-01-18 DIAGNOSIS — D1739 Benign lipomatous neoplasm of skin and subcutaneous tissue of other sites: Secondary | ICD-10-CM

## 2013-01-18 DIAGNOSIS — D171 Benign lipomatous neoplasm of skin and subcutaneous tissue of trunk: Secondary | ICD-10-CM

## 2013-01-18 NOTE — Progress Notes (Signed)
Subjective:     Chad [name] is a 37 y.o. male and is here for evaluation of swollen area L posterior side. He noticed area a few weeks ago. He has not noticed changes, denies tenderness, nausea, change in bowel or bladder habits, no appetite changes.   a comprehensive physical exam. He also reports a "tingly" feeling R abdomen since he has been concerned about swollen Area.  History   Social History  . Marital Status: Significant Other    Spouse Name: N/A    Number of Children: 1  . 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 Smoking status: Former Smoker -- 0.30 packs/day for 8 years    Types: Cigarettes  . Smokeless tobacco: Not on file  . Alcohol Use: Yes     Comment: occ beer  . Drug Use: No  . Sexual Activity: Not on file   Other Topics Concern  . Not on file   Social History Narrative   Married, has 73 y/o son, lives in [place], [place].  Originally from [place], [place].   Alcohol use-no   Drug use-no   Exercise: intermittent, treadmill and elliptical machine.   Active in cub scouts.   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            Health Maintenance  Topic Date Due  . Influenza Vaccine  08/27/2012  . Tetanus/tdap  08/24/2022    The following portions of the patient's history were reviewed and updated as appropriate: allergies, current medications, past medical history, past surgical history and problem list.  Review of Systems Constitutional: negative for chills, fatigue, fevers, night sweats and weight loss Respiratory: negative for cough, sputum and wheezing Cardiovascular: negative for chest pain, chest pressure/discomfort, irregular heart beat, lower extremity edema and near-syncope Gastrointestinal: positive for tingly feeling R abdomen for few days., negative for abdominal pain, change in bowel habits and nausea Genitourinary:negative for dysuria, frequency, hematuria and  hesitancy Integument/breast: positive for lump posterior L side   Objective:    BP 130/84  Pulse 67  Temp(Src) 97.4 F (36.3 C) (Oral)  Ht 5\' 11"  (1.803 m)  Wt 341 lb (154.677 kg)  BMI 47.58 kg/m2  SpO2 97% General appearance: alert, cooperative, appears stated age, mild distress, morbidly obese and pt states he is nervous Head: Normocephalic, without obvious abnormality, atraumatic Eyes: negative findings: lids and lashes normal and conjunctivae and sclerae normal Lungs: clear to auscultation bilaterally Heart: regular rate and rhythm, S1, S2 normal, no murmur, click, rub or gallop Abdomen: soft, non-tender; bowel sounds normal; no masses,  no organomegaly and obese abdomen Skin: superficial ovoid fatty lesion approx 4 cm X 2.5 cm L posterior side, near flank.    Assessment:   Lipoma     Plan:    Discussed treatment options: conservative-watch & wait, which seems to make patient uneasy or CT or MRI. Pt is agreeable to watch & wait. He will let us know if there are changes or he has other concerns. See After Visit Summary for Counseling Recommendations

## 2013-01-18 NOTE — Patient Instructions (Signed)
Typically, lipomas do not require treatment, however, please let us know if you notice changes or have other concerns.  Lipoma A lipoma is a noncancerous (benign) tumor composed of fat cells. They are usually found under the skin (subcutaneous). A lipoma may occur in any tissue of the body that contains fat. Common areas for lipomas to appear include the back, shoulders, buttocks, and thighs. Lipomas are a very common soft tissue growth. They are soft and grow slowly. Most problems caused by a lipoma depend on where it is growing. DIAGNOSIS  A lipoma can be diagnosed with a physical exam. These tumors rarely become cancerous, but radiographic studies can help determine this for certain. Studies used may include:  Computerized X-ray scans (CT or CAT scan).  Computerized magnetic scans (MRI). TREATMENT  Small lipomas that are not causing problems may be watched. If a lipoma continues to enlarge or causes problems, removal is often the best treatment. Lipomas can also be removed to improve appearance. Surgery is done to remove the fatty cells and the surrounding capsule. Most often, this is done with medicine that numbs the area (local anesthetic). The removed tissue is examined under a microscope to make sure it is not cancerous. Keep all follow-up appointments with your caregiver. SEEK MEDICAL CARE IF:   The lipoma becomes larger or hard.  The lipoma becomes painful, red, or increasingly swollen. These could be signs of infection or a more serious condition. Document Released: 01/03/2002 Document Revised: 04/07/2011 Document Reviewed: 06/15/2009 [facility] Patient Information 2014 [place], [place].

## 2013-01-18 NOTE — Telephone Encounter (Signed)
Patient Information:  Caller Name: Chad [name]  Phone: [phone]  Patient: Chad, [name]  Gender: Male  DOB: [birth year]  Age: 37 Years  PCP: Chad [name] [facility])  Office Follow Up:  Does the office need to follow up with this patient?: No  Instructions For The Office: N/A  RN Note:  Reported MD has seen and evaluated this lump at past physicals.  Wife feels he should get it checked.  Symptoms  Reason For Call & Symptoms: Called to request appointment for chronic 1-2" lump on back between left hip and rib.  Noted lump feels different for the past 2-3 weeks; now slighlty sensitive if moved, and larger.  Requested MD call back 01/17/13 to discuss.   Reviewed Health History In EMR: Yes  Reviewed Medications In EMR: Yes  Reviewed Allergies In EMR: Yes  Reviewed Surgeries / Procedures: Yes  Date of Onset of Symptoms: 12/28/2012  Guideline(s) Used:  Skin Lesion - Moles or Growths  Disposition Per Guideline:   See Within 3 Days in Office  Reason For Disposition Reached:   Patient wants to be seen  Advice Given:  Call Back If:  Fever or pain occurs  You become worse.  Patient Will Follow Care Advice:  YES  Appointment Scheduled:  01/18/2013 15:00:00 Appointment Scheduled Provider:  Maximino Sarin

## 2013-01-18 NOTE — Progress Notes (Signed)
Pre-visit discussion using our clinic review tool. No additional management support is needed unless otherwise documented below in the visit note.  

## 2013-06-04 ENCOUNTER — Encounter (HOSPITAL_COMMUNITY): Payer: Self-pay | Admitting: Emergency Medicine

## 2013-06-04 ENCOUNTER — Emergency Department (HOSPITAL_COMMUNITY)
Admission: EM | Admit: 2013-06-04 | Discharge: 2013-06-04 | Disposition: A | Discharge status: Home / Self Care | Payer: Commercial Managed Care - PPO | Source: Home / Self Care | Attending: Emergency Medicine | Admitting: Emergency Medicine

## 2013-06-04 DIAGNOSIS — W268XXA Contact with other sharp object(s), not elsewhere classified, initial encounter: Secondary | ICD-10-CM

## 2013-06-04 DIAGNOSIS — S90859A Superficial foreign body, unspecified foot, initial encounter: Secondary | ICD-10-CM

## 2013-06-04 DIAGNOSIS — Y92009 Unspecified place in unspecified non-institutional (private) residence as the place of occurrence of the external cause: Secondary | ICD-10-CM

## 2013-06-04 DIAGNOSIS — IMO0002 Reserved for concepts with insufficient information to code with codable children: Secondary | ICD-10-CM

## 2013-06-04 NOTE — ED Provider Notes (Signed)
  Chief Complaint   Chief Complaint  Patient presents with  . Foreign Body in Skin    History of Present Illness   Chad [name] is a 38 year old male who stepped on a piece of broken glass in his home today. He has a small glass under his left anterior foot. This is painful when he steps on it. His tetanus immunizations are up-to-date.  Review of Systems   Other than as noted above, the patient denies any of the following symptoms: Systemic:  No fevers or chills. Musculoskeletal:  No joint pain or arthritis.  Neurological:  No muscular weakness, paresthesias.   [place]   Past medical history, family history, social history, meds, and allergies were reviewed.     Physical  Examination     Vital signs:  There were no vitals taken for this visit. Gen:  Alert and oriented times 3.  In no distress. Musculoskeletal:  Exam of the foot reveals there is a small puncture wound with a glass shard protruding from it in the anterior left foot. No evidence of infection.  Otherwise, all joints had a full a ROM with no swelling, bruising or deformity.  No edema, pulses full. Extremities were warm and pink.  Capillary refill was brisk.  Skin:  Clear, warm and dry.  No rash. Neuro:  Alert and oriented times 3.  Muscle strength was normal.  Sensation was intact to light touch.   Procedure Note:  Verbal informed consent was obtained from the patient.  Risks and benefits were outlined with the patient.  Patient understands and accepts these risks. A time out was called and the procedure and identity of the patient were confirmed verbally.    The procedure was then performed as follows:  The area was prepped with alcohol and a glass bladder was removed without any difficulty. Antibiotic ointment and a Band-Aid were applied.  The patient tolerated the procedure well without any immediate complications.    Assessment   The encounter diagnosis was Foreign body in foot.  Plan    1.  Meds:  The  following meds were prescribed:   Discharge Medication List as of 06/04/2013  4:13 PM      2.  Patient Education/Counseling:  The patient was given appropriate handouts, self care instructions, and instructed in wound care.    3.  Follow up:  The patient was told to follow up here if no better in 3 to 4 days, or sooner if becoming worse in any way, and given some red flag symptoms such as any sign of infection which would prompt immediate return.  Follow up here as necessary.       Harden Mo, MD 06/04/13 [phone]

## 2013-06-04 NOTE — Discharge Instructions (Signed)
Sliver Removal °You have had a sliver (splinter) removed. This has caused a wound that extends through some or all layers of the skin and possibly into the subcutaneous tissue. This is the tissue just beneath the skin. Because these wounds can not be cleaned well, it is necessary to watch closely for infection. °AFTER THE PROCEDURE  °If a cut (incision) was necessary to remove this, it may have been repaired for you by your caregiver either with suturing, stapling, or adhesive strips. These keep together the skin edges and allow better and faster healing. °HOME CARE INSTRUCTIONS  °· A dressing may have been applied. This may be changed once per day or as instructed. If the dressing sticks, it may be soaked off with a gauze pad or clean cloth that has been dampened with soapy water or hydrogen peroxide. °· It is difficult to remove all slivers or foreign bodies as they may break or splinter into smaller pieces. Be aware that your body will work to remove the foreign substance. That is, the foreign body may work itself out of the wound. That is normal. °· Watch for signs of infection and notify your caregiver if you suspect a sliver or foreign body remains in the wound. °· You may have received a recommendation to follow up with your physician or a specialist. It is very important to call for or keep follow-up appointments in order to avoid infection or other complications. °· Only take over-the-counter or prescription medicines for pain, discomfort, or fever as directed by your caregiver. °· If antibiotics were prescribed, be sure to finish all of the medicine. °If you did not receive a tetanus shot today because you did not recall when your last one was given, check with your caregiver in the next day or two during follow up to determine if one is needed. °SEEK MEDICAL CARE IF:  °· The area around the wound has new or worsening redness or tenderness. °· Pus is coming from the wound °· There is a foul smell from the  wound or dressing °· The edges of a wound that had been repaired break open °SEEK IMMEDIATE MEDICAL CARE IF:  °· Red streaks are coming from the wound °· An unexplained oral temperature above 102° F (38.9° C) develops. °Document Released: 01/11/2000 Document Revised: 04/07/2011 Document Reviewed: 08/30/2007 °Exit[employer]® Patient Information ©2014 [employer]. ° °

## 2013-06-04 NOTE — ED Notes (Signed)
Stepped on piece of glass this AM.  Tried to get out with a tweezers without success.  Has soaked it in epsom salts.  Pain intermitant with walking.

## 2013-06-29 ENCOUNTER — Ambulatory Visit (INDEPENDENT_AMBULATORY_CARE_PROVIDER_SITE_OTHER): Payer: Commercial Managed Care - PPO | Admitting: Family Medicine

## 2013-06-29 ENCOUNTER — Telehealth: Payer: Self-pay | Admitting: Family Medicine

## 2013-06-29 ENCOUNTER — Encounter: Payer: Self-pay | Admitting: Family Medicine

## 2013-06-29 VITALS — BP 147/70 | HR 65 | Temp 98.1°F | Resp 18 | Ht 71.0 in | Wt 357.0 lb

## 2013-06-29 DIAGNOSIS — S39012A Strain of muscle, fascia and tendon of lower back, initial encounter: Secondary | ICD-10-CM | POA: Insufficient documentation

## 2013-06-29 DIAGNOSIS — S335XXA Sprain of ligaments of lumbar spine, initial encounter: Secondary | ICD-10-CM

## 2013-06-29 NOTE — Patient Instructions (Signed)
WALK DAILY   Back Exercises These exercises may help you when beginning to rehabilitate your injury. Your symptoms may resolve with or without further involvement from your physician, physical therapist or athletic trainer. While completing these exercises, remember:   Restoring tissue flexibility helps normal motion to return to the joints. This allows healthier, less painful movement and activity.  An effective stretch should be held for at least 30 seconds.  A stretch should never be painful. You should only feel a gentle lengthening or release in the stretched tissue. STRETCH  Extension, Prone on Elbows   Lie on your stomach on the floor, a bed will be too soft. Place your palms about shoulder width apart and at the height of your head.  Place your elbows under your shoulders. If this is too painful, stack pillows under your chest.  Allow your body to relax so that your hips drop lower and make contact more completely with the floor.  Hold this position for __________ seconds.  Slowly return to lying flat on the floor. Repeat __________ times. Complete this exercise __________ times per day.  RANGE OF MOTION  Extension, Prone Press Ups   Lie on your stomach on the floor, a bed will be too soft. Place your palms about shoulder width apart and at the height of your head.  Keeping your back as relaxed as possible, slowly straighten your elbows while keeping your hips on the floor. You may adjust the placement of your hands to maximize your comfort. As you gain motion, your hands will come more underneath your shoulders.  Hold this position __________ seconds.  Slowly return to lying flat on the floor. Repeat __________ times. Complete this exercise __________ times per day.  RANGE OF MOTION- Quadruped, Neutral Spine   Assume a hands and knees position on a firm surface. Keep your hands under your shoulders and your knees under your hips. You may place padding under your knees for  comfort.  Drop your head and point your tail bone toward the ground below you. This will round out your low back like an angry cat. Hold this position for __________ seconds.  Slowly lift your head and release your tail bone so that your back sags into a large arch, like an old horse.  Hold this position for __________ seconds.  Repeat this until you feel limber in your low back.  Now, find your "sweet spot." This will be the most comfortable position somewhere between the two previous positions. This is your neutral spine. Once you have found this position, tense your stomach muscles to support your low back.  Hold this position for __________ seconds. Repeat __________ times. Complete this exercise __________ times per day.  STRETCH  Flexion, Single Knee to Chest   Lie on a firm bed or floor with both legs extended in front of you.  Keeping one leg in contact with the floor, bring your opposite knee to your chest. Hold your leg in place by either grabbing behind your thigh or at your knee.  Pull until you feel a gentle stretch in your low back. Hold __________ seconds.  Slowly release your grasp and repeat the exercise with the opposite side. Repeat __________ times. Complete this exercise __________ times per day.  STRETCH - Hamstrings, Standing  Stand or sit and extend your right / left leg, placing your foot on a chair or foot stool  Keeping a slight arch in your low back and your hips straight forward.  Lead  with your chest and lean forward at the waist until you feel a gentle stretch in the back of your right / left knee or thigh. (When done correctly, this exercise requires leaning only a small distance.)  Hold this position for __________ seconds. Repeat __________ times. Complete this stretch __________ times per day. STRENGTHENING  Deep Abdominals, Pelvic Tilt   Lie on a firm bed or floor. Keeping your legs in front of you, bend your knees so they are both pointed  toward the ceiling and your feet are flat on the floor.  Tense your lower abdominal muscles to press your low back into the floor. This motion will rotate your pelvis so that your tail bone is scooping upwards rather than pointing at your feet or into the floor.  With a gentle tension and even breathing, hold this position for __________ seconds. Repeat __________ times. Complete this exercise __________ times per day.  STRENGTHENING  Abdominals, Crunches   Lie on a firm bed or floor. Keeping your legs in front of you, bend your knees so they are both pointed toward the ceiling and your feet are flat on the floor. Cross your arms over your chest.  Slightly tip your chin down without bending your neck.  Tense your abdominals and slowly lift your trunk high enough to just clear your shoulder blades. Lifting higher can put excessive stress on the low back and does not further strengthen your abdominal muscles.  Control your return to the starting position. Repeat __________ times. Complete this exercise __________ times per day.  STRENGTHENING  Quadruped, Opposite UE/LE Lift   Assume a hands and knees position on a firm surface. Keep your hands under your shoulders and your knees under your hips. You may place padding under your knees for comfort.  Find your neutral spine and gently tense your abdominal muscles so that you can maintain this position. Your shoulders and hips should form a rectangle that is parallel with the floor and is not twisted.  Keeping your trunk steady, lift your right hand no higher than your shoulder and then your left leg no higher than your hip. Make sure you are not holding your breath. Hold this position __________ seconds.  Continuing to keep your abdominal muscles tense and your back steady, slowly return to your starting position. Repeat with the opposite arm and leg. Repeat __________ times. Complete this exercise __________ times per day. Document Released:  01/31/2005 Document Revised: 04/07/2011 Document Reviewed: 04/27/2008 [facility] Patient Information 2014 [place], [place].

## 2013-06-29 NOTE — Telephone Encounter (Signed)
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 Pain worsens with certian movements. 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   Symptoms  Reason For Call & Symptoms: Left flank pain; suspects "pulled muscle or pinched nerve."   Pain is dull when sits or stands (1.5/10);  pain significantly aggravated by movement or movement of leg 7/10).  Reviewed Health History In EMR: Yes  Reviewed Medications In EMR: Yes  Reviewed Allergies In EMR: Yes  Reviewed Surgeries / Procedures: Yes  Date of Onset of Symptoms: 06/26/2013  Treatments Tried: Tylelol  Treatments Tried Worked: Yes  Guideline(s) Used:  Flank Pain  Disposition Per Guideline:   See Today in Office  Reason For Disposition Reached:   Moderate pain (e.g., interferes with normal activities or awakens from sleep)  Advice Given:  Reassurance:  Mild flank and back pain can result from excessive twisting, heavy lifting, or from an un-noticed minor back injury.  Cold or Heat:  Heat Pack: If pain lasts over 2 days, apply heat to the sore area. Use a heat pack, heating pad, or warm wet washcloth. Do this for 10 minutes, then as needed.  Activity:  Continue normal activities as much as your pain permits.  Staying active is usually more healing for the back than rest.  Avoid any activities that significantly increase the pain. Avoid heavy lifting and strenuous exercise until completely well. Staying in bed is not needed.  Call Back If:  Fever over 100.5 F (38.1 C)  Burning with urination or blood in urine  You become worse.  Patient Will Follow Care Advice:  YES  Appointment Scheduled:  06/29/2013  11:15:00 Appointment Scheduled Provider:  Ricardo Jericho [facility])

## 2013-06-29 NOTE — Progress Notes (Signed)
OFFICE NOTE  06/29/2013  CC:  Chief Complaint  Patient presents with  . Back Pain    left flank pain     HPI: Patient is a 38 y.o. Caucasian male who is here for pain. Onset in back about 3-4 days ago, left lower back region extending into left upper glut.  Twisting hurts a little. Lifting left leg exacerbates it some.  No radiation down leg or paresthesias. Prior to this he had slept on an uncomfortable bed at a friend's house, then the next day worked 6 hours in a yard. Walking helps it.  Took 3 tylenol last night b/c of more pain when he woke up to pee. No saddle anesthesia.  No loss of bowel/bladder control.   Pertinent PMH:  Past medical, surgical, social, and family history reviewed and no changes are noted since last office visit. No hx of back problems.  MEDS:  Outpatient Prescriptions Prior to Visit  Medication Sig Dispense Refill  . Multiple Vitamin (MULTIVITAMIN) tablet Take 1 tablet by mouth daily.        . Probiotic Product (MISC INTESTINAL FLORA REGULAT) CHEW Try Digestive Advantage gummies by [employer]isen[employer] for any month you are on antibiotics    0   No facility-administered medications prior to visit.    PE: Blood pressure 147/70, pulse 65, temperature 98.1 F (36.7 C), temperature source Oral, resp. rate 18, height 5\' 11"  (1.803 m), weight 357 lb (161.934 kg), SpO2 97.00%. Gen: Alert, well appearing, obese-appearing.  Patient is oriented to person, place, time, and situation. Throat: no erythema, swelling, or exudate.  No focal lesion. AFFECT: pleasant, lucid thought and speech. BACK: ROM intact, mild pain in LLB with flexion.  Mild pain in LLB with lateral bending at the waist.  Mild TTP in LLB region. No tenderness over ischial tuberosity.  SLR elicits pain in LLB with full extension of either leg, but no radiculopathy sx's. LE strength intact prox and dist, except his left leg prox strength is diminished due to pain in LB with effort. Patellar DTRs 1+ bilat,  achilles DTRs 1+ bilat.  IMPRESSION AND PLAN:  1) Acute LB musculoskeletal strain: discussed tylenol (NSAID allergy), continue with activity, do LB stretches (handout given), give this more time.  Discussed PT as an option if this plan does not help in about 7-10d. He declined short term pain med and short term muscle relaxer.  2) Exposure to strep: mild ST briefly last night, no ST today.  No fever or malaise. No testing indicated today,no meds.  If sx's develop within 10d of his girlfriend's illness then he'll call and I'll empirically treat him for strep.  An After Visit Summary was printed and given to the patient.  FOLLOW UP: prn

## 2015-07-19 ENCOUNTER — Telehealth: Payer: Self-pay | Admitting: Family Medicine

## 2015-07-19 NOTE — Telephone Encounter (Signed)
Emailed Tb documentation to patient.

## 2015-07-19 NOTE — Telephone Encounter (Signed)
Patient needs a copy of his immunizations emailed to [email or web address]. Thanks

## 2015-07-19 NOTE — Telephone Encounter (Signed)
Copy of Immunizations given to Estill Bamberg to email to pt.

## 2015-07-20 ENCOUNTER — Encounter: Payer: Commercial Managed Care - PPO | Admitting: Family Medicine

## 2016-02-28 DIAGNOSIS — S99912A Unspecified injury of left ankle, initial encounter: Secondary | ICD-10-CM | POA: Diagnosis not present

## 2016-02-28 DIAGNOSIS — S8992XA Unspecified injury of left lower leg, initial encounter: Secondary | ICD-10-CM | POA: Diagnosis not present

## 2016-02-28 DIAGNOSIS — W098XXA Fall on or from other playground equipment, initial encounter: Secondary | ICD-10-CM | POA: Diagnosis not present

## 2016-04-17 DIAGNOSIS — S4991XA Unspecified injury of right shoulder and upper arm, initial encounter: Secondary | ICD-10-CM | POA: Diagnosis not present

## 2016-05-30 DIAGNOSIS — M75101 Unspecified rotator cuff tear or rupture of right shoulder, not specified as traumatic: Secondary | ICD-10-CM | POA: Diagnosis not present

## 2016-06-10 DIAGNOSIS — M75101 Unspecified rotator cuff tear or rupture of right shoulder, not specified as traumatic: Secondary | ICD-10-CM | POA: Diagnosis not present

## 2016-06-10 DIAGNOSIS — M25511 Pain in right shoulder: Secondary | ICD-10-CM | POA: Diagnosis not present

## 2016-06-10 DIAGNOSIS — M75111 Incomplete rotator cuff tear or rupture of right shoulder, not specified as traumatic: Secondary | ICD-10-CM | POA: Diagnosis not present

## 2016-06-10 DIAGNOSIS — M25411 Effusion, right shoulder: Secondary | ICD-10-CM | POA: Diagnosis not present

## 2016-06-20 DIAGNOSIS — M75101 Unspecified rotator cuff tear or rupture of right shoulder, not specified as traumatic: Secondary | ICD-10-CM | POA: Diagnosis not present

## 2016-10-10 DIAGNOSIS — M75101 Unspecified rotator cuff tear or rupture of right shoulder, not specified as traumatic: Secondary | ICD-10-CM | POA: Diagnosis not present

## 2017-05-20 ENCOUNTER — Ambulatory Visit: Payer: Commercial Managed Care - PPO | Admitting: Family Medicine

## 2018-06-07 ENCOUNTER — Ambulatory Visit: Payer: Commercial Managed Care - PPO | Admitting: Family Medicine

## 2018-07-13 ENCOUNTER — Telehealth: Payer: Self-pay | Admitting: Hematology

## 2018-07-13 DIAGNOSIS — Z20822 Contact with and (suspected) exposure to covid-19: Secondary | ICD-10-CM

## 2018-07-13 NOTE — Telephone Encounter (Signed)
Request from Silverio Decamp, NP from occupational health to do [place] / Patient contacted, scheduled and orders placed

## 2018-07-14 ENCOUNTER — Other Ambulatory Visit: Payer: Commercial Managed Care - PPO

## 2018-07-19 ENCOUNTER — Encounter: Payer: Self-pay | Admitting: Family Medicine

## 2018-07-19 ENCOUNTER — Ambulatory Visit: Payer: Commercial Managed Care - PPO | Admitting: Family Medicine

## 2018-07-19 ENCOUNTER — Other Ambulatory Visit: Payer: Self-pay

## 2018-07-19 VITALS — BP 131/82 | HR 70 | Temp 98.1°F | Resp 18 | Ht 71.0 in | Wt 320.8 lb

## 2018-07-19 DIAGNOSIS — I10 Essential (primary) hypertension: Secondary | ICD-10-CM

## 2018-07-19 DIAGNOSIS — R7301 Impaired fasting glucose: Secondary | ICD-10-CM

## 2018-07-19 DIAGNOSIS — Z0000 Encounter for general adult medical examination without abnormal findings: Secondary | ICD-10-CM | POA: Diagnosis not present

## 2018-07-19 LAB — CBC WITH DIFFERENTIAL/PLATELET
Basophils Absolute: 0.1 10*3/uL (ref 0.0–0.1)
Basophils Relative: 0.9 % (ref 0.0–3.0)
Eosinophils Absolute: 0.1 10*3/uL (ref 0.0–0.7)
Eosinophils Relative: 0.9 % (ref 0.0–5.0)
HCT: 40.9 % (ref 39.0–52.0)
Hemoglobin: 13.5 g/dL (ref 13.0–17.0)
Lymphocytes Relative: 24 % (ref 12.0–46.0)
Lymphs Abs: 2.5 10*3/uL (ref 0.7–4.0)
MCHC: 32.9 g/dL (ref 30.0–36.0)
MCV: 83.4 fl (ref 78.0–100.0)
Monocytes Absolute: 0.5 10*3/uL (ref 0.1–1.0)
Monocytes Relative: 5.2 % (ref 3.0–12.0)
Neutro Abs: 7.1 10*3/uL (ref 1.4–7.7)
Neutrophils Relative %: 69 % (ref 43.0–77.0)
Platelets: 273 10*3/uL (ref 150.0–400.0)
RBC: 4.91 Mil/uL (ref 4.22–5.81)
RDW: 16.2 % — ABNORMAL HIGH (ref 11.5–15.5)
WBC: 10.3 10*3/uL (ref 4.0–10.5)

## 2018-07-19 LAB — LIPID PANEL
Cholesterol: 191 mg/dL (ref 0–200)
HDL: 50.8 mg/dL (ref >39.00)
LDL Cholesterol: 123 mg/dL — ABNORMAL HIGH (ref 0–99)
NonHDL: 140.45
Total CHOL/HDL Ratio: 4
Triglycerides: 88 mg/dL (ref 0.0–149.0)
VLDL: 17.6 mg/dL (ref 0.0–40.0)

## 2018-07-19 LAB — COMPREHENSIVE METABOLIC PANEL
ALT: 21 U/L (ref 0–53)
AST: 13 U/L (ref 0–37)
Albumin: 4.4 g/dL (ref 3.5–5.2)
Alkaline Phosphatase: 86 U/L (ref 39–117)
BUN: 15 mg/dL (ref 6–23)
CO2: 29 mEq/L (ref 19–32)
Calcium: 9 mg/dL (ref 8.4–10.5)
Chloride: 104 mEq/L (ref 96–112)
Creatinine, Ser: 0.79 mg/dL (ref 0.40–1.50)
GFR: 107.01 mL/min (ref >60.00)
Glucose, Bld: 97 mg/dL (ref 70–99)
Potassium: 4.3 mEq/L (ref 3.5–5.1)
Sodium: 140 mEq/L (ref 135–145)
Total Bilirubin: 0.5 mg/dL (ref 0.2–1.2)
Total Protein: 6.6 g/dL (ref 6.0–8.3)

## 2018-07-19 LAB — TSH: TSH: 2.5 u[IU]/mL (ref 0.35–4.50)

## 2018-07-19 LAB — HEMOGLOBIN A1C: Hgb A1c MFr Bld: 5.5 % (ref 4.6–6.5)

## 2018-07-19 NOTE — Progress Notes (Signed)
OFFICE VISIT  07/19/2018   CC:  Chief Complaint  Patient presents with  . Hypertension   HPI:    Patient is a 43 y.o. Caucasian male who presents for f/u elevated blood pressures w/out dx of HTN. Review of in-office bp measurements over the last 45yrs have been 130s-140s over 80s-90s.  HR avg 75.  I have not seen him since 06/29/2013. He has been getting annual screening exams/labs and he has been told that things have been wnl. Wants to return here for well care and sick care. He feels well.  Recent covid testing mandated by his employer came back neg.  He has never had sx's of covid. He has been eating MUCH better (lost approx 60 lbs in the last 2-3 yrs) but has not started exercising yet.  We discussed his hx of elevated bp's here in the past.  Today's bp about like past ones. He does need to start antihypertensive at this time but wants to continue with diet and start his exercise regimen to give more TLC a chance.  He will buy an upper arm bp cuff and check bp and HR twice per week and we'll review these at next check in 6 mo.  Past Medical History:  Diagnosis Date  . Elevated blood pressure reading without diagnosis of hypertension 12/31/2011  . Hypertriglyceridemia 10/2009   mild  . Insulin resistance 10/2009   HbA1C 6.3%  . Obesities, morbid ([place])   . Transaminasemia 10/2009   presumed fatty liver    Past Surgical History:  Procedure Laterality Date  . MOUTH SURGERY  1991    Outpatient Medications Prior to Visit  Medication Sig Dispense Refill  . Multiple Vitamin (MULTIVITAMIN) tablet Take 1 tablet by mouth daily.      . Probiotic Product (MISC INTESTINAL FLORA REGULAT) CHEW Try Digestive Advantage gummies by [place] for any month you are on antibiotics  0  . Psyllium (METAMUCIL PO) Take by mouth.     No facility-administered medications prior to visit.     Allergies  Allergen Reactions  . Aspirin Hives  . Aspirin-Acetaminophen-Caffeine   . Ibuprofen Hives  .  Naproxen Sodium     Review of Systems  Constitutional: Negative for appetite change, chills, fatigue and fever.  HENT: Negative for congestion, dental problem, ear pain and sore throat.   Eyes: Negative for discharge, redness and visual disturbance.  Respiratory: Negative for cough, chest tightness, shortness of breath and wheezing.   Cardiovascular: Negative for chest pain, palpitations and leg swelling.  Gastrointestinal: Negative for abdominal pain, blood in stool, diarrhea, nausea and vomiting.  Genitourinary: Negative for difficulty urinating, dysuria, flank pain, frequency, hematuria and urgency.  Musculoskeletal: Negative for arthralgias, back pain, joint swelling, myalgias and neck stiffness.  Skin: Negative for pallor and rash.  Neurological: Negative for dizziness, speech difficulty, weakness and headaches.  Hematological: Negative for adenopathy. Does not bruise/bleed easily.  Psychiatric/Behavioral: Negative for confusion and sleep disturbance. The patient is not nervous/anxious.     PE: Blood pressure 131/82, pulse 70, temperature 98.1 F (36.7 C), temperature source Temporal, resp. rate 18, height 5\' 11"  (1.803 m), weight (!) 320 lb 12.8 oz (145.5 kg), SpO2 97 %. Body mass index is 44.74 kg/m. Gen: Alert, well appearing.  Patient is oriented to person, place, time, and situation. AFFECT: pleasant, lucid thought and speech. ENT: Ears: EACs clear, normal epithelium.  TMs with good light reflex and landmarks bilaterally.  Eyes: no injection, icteris, swelling, or exudate.  EOMI, PERRLA. Nose:  no drainage or turbinate edema/swelling.  No injection or focal lesion.  Mouth: lips without lesion/swelling.  Oral mucosa pink and moist.  Dentition intact and without obvious caries or gingival swelling.  Oropharynx without erythema, exudate, or swelling.  Neck: supple/nontender.  No LAD, mass, or TM.  Carotid pulses 2+ bilaterally, without bruits. CV: RRR, no m/r/g.   LUNGS: CTA bilat,  nonlabored resps, good aeration in all lung fields. ABD: soft, NT, ND, BS normal.  No hepatospenomegaly or mass.  No bruits. EXT: no clubbing, cyanosis, or edema.  Musculoskeletal: no joint swelling, erythema, warmth, or tenderness.  ROM of all joints intact. Skin - no sores or suspicious lesions or rashes or color changes   LABS:    Chemistry      Component Value Date/Time   NA 139 08/23/2012 1004   K 3.7 08/23/2012 1004   CL 104 08/23/2012 1004   CO2 30 08/23/2012 1004   BUN 13 08/23/2012 1004   CREATININE 0.8 08/23/2012 1004      Component Value Date/Time   CALCIUM 9.1 08/23/2012 1004   ALKPHOS 79 08/23/2012 1004   AST 15 08/23/2012 1004   ALT 17 08/23/2012 1004   BILITOT 0.4 08/23/2012 1004     Lab Results  Component Value Date   WBC 8.3 08/23/2012   HGB 12.7 (L) 08/23/2012   HCT 38.6 (L) 08/23/2012   MCV 84.2 08/23/2012   PLT 242.0 08/23/2012   Lab Results  Component Value Date   CHOL 205 (H) 08/23/2012   HDL 44.50 08/23/2012   LDLCALC 109 (H) 03/07/2010   LDLDIRECT 137.1 08/23/2012   TRIG 150.0 (H) 08/23/2012   CHOLHDL 5 08/23/2012   Lab Results  Component Value Date   TSH 1.27 08/23/2012   Lab Results  Component Value Date   HGBA1C 5.6 03/07/2010    IMPRESSION AND PLAN:  Re-establishing care/technically a new pt (last visit was > 3 yrs ago).  1) Ess hypertension: recommended med but he wants to give diet and STARTING exercise more time (6 mo). Discussed getting bp cuff and monitoring bp outside of medical office. DASH diet- he already does well with this.   2) Health maintenance exam: Reviewed age and gender appropriate health maintenance issues (prudent diet, regular exercise, health risks of tobacco and excessive alcohol, use of seatbelts, fire alarms in home, use of sunscreen).  Also reviewed age and gender appropriate health screening as well as vaccine recommendations. Vaccines: UTD. Labs: fasting HP + Hba1c today (hx of IFG). Colon ca and  prostate ca screening: average risk patient= as per latest guidelines, start screening at 4 yrs of age.  An After Visit Summary was printed and given to the patient.  FOLLOW UP: Return in about 6 months (around 01/18/2019) for f/u HTN.  Signed:  Crissie Sickles, MD           07/19/2018

## 2018-07-19 NOTE — Patient Instructions (Signed)

## 2018-07-20 ENCOUNTER — Telehealth: Payer: Self-pay | Admitting: Family Medicine

## 2018-07-20 NOTE — Telephone Encounter (Signed)
Patient calling requesting test results Please call [phone]  Thank you

## 2018-07-20 NOTE — Telephone Encounter (Signed)
Pt was advised results have not been annotated on yet. Will receive a call back once it has been done.

## 2018-07-21 ENCOUNTER — Telehealth: Payer: Self-pay

## 2018-07-21 ENCOUNTER — Telehealth: Payer: Self-pay | Admitting: Family Medicine

## 2018-07-21 NOTE — Telephone Encounter (Signed)
My Chart message sent

## 2018-07-21 NOTE — Telephone Encounter (Signed)
error 

## 2018-07-21 NOTE — Telephone Encounter (Signed)
MyChart message sent regarding results.  Copied from [place] [id number]. Topic: General - Inquiry >> Jul 21, 2018 12:34 PM Edmonia Caprio wrote: Reason for CRM: 2nd request for call back about test results

## 2019-01-18 ENCOUNTER — Ambulatory Visit: Payer: Commercial Managed Care - PPO | Admitting: Family Medicine

## 2019-01-18 NOTE — Progress Notes (Deleted)
OFFICE VISIT  01/18/2019   CC: No chief complaint on file.    HPI:    Patient is a 43 y.o. Caucasian male who presents for 6 mo f/u HTN, Hypertrigs, and prediabetes. A/P as of last visit: "Ess hypertension: recommended med but he wants to give diet and STARTING exercise more time (6 mo). Discussed getting bp cuff and monitoring bp outside of medical office. DASH diet- he already does well with this.  He has done a great job at weight loss with diet changes alone.  Encouraged him to add CV exercise!  Interim Hx: ***  Past Medical History:  Diagnosis Date  . Elevated blood pressure reading without diagnosis of hypertension 12/31/2011  . Hypertriglyceridemia 10/2009   mild  . Insulin resistance 10/2009   HbA1C 6.3%  . Obesities, morbid ([place])   . Transaminasemia 10/2009   presumed fatty liver    Past Surgical History:  Procedure Laterality Date  . MOUTH SURGERY  1991    Outpatient Medications Prior to Visit  Medication Sig Dispense Refill  . Multiple Vitamin (MULTIVITAMIN) tablet Take 1 tablet by mouth daily.      . Probiotic Product (MISC INTESTINAL FLORA REGULAT) CHEW Try Digestive Advantage gummies by [place] for any month you are on antibiotics  0  . Psyllium (METAMUCIL PO) Take by mouth.     No facility-administered medications prior to visit.    Allergies  Allergen Reactions  . Aspirin Hives  . Aspirin-Acetaminophen-Caffeine   . Ibuprofen Hives  . Naproxen Sodium     ROS As per HPI  PE: There were no vitals taken for this visit. ***  LABS:  Lab Results  Component Value Date   TSH 2.50 07/19/2018   Lab Results  Component Value Date   WBC 10.3 07/19/2018   HGB 13.5 07/19/2018   HCT 40.9 07/19/2018   MCV 83.4 07/19/2018   PLT 273.0 07/19/2018   Lab Results  Component Value Date   CREATININE 0.79 07/19/2018   BUN 15 07/19/2018   NA 140 07/19/2018   K 4.3 07/19/2018   CL 104 07/19/2018   CO2 29 07/19/2018   Lab Results  Component Value  Date   ALT 21 07/19/2018   AST 13 07/19/2018   ALKPHOS 86 07/19/2018   BILITOT 0.5 07/19/2018   Lab Results  Component Value Date   CHOL 191 07/19/2018   Lab Results  Component Value Date   HDL 50.80 07/19/2018   Lab Results  Component Value Date   LDLCALC 123 (H) 07/19/2018   Lab Results  Component Value Date   TRIG 88.0 07/19/2018   Lab Results  Component Value Date   CHOLHDL 4 07/19/2018   Lab Results  Component Value Date   HGBA1C 5.5 07/19/2018    IMPRESSION AND PLAN:  No problem-specific Assessment & Plan notes found for this encounter.  No labs "needed" ? FLP and BMET?  An After Visit Summary was printed and given to the patient.  FOLLOW UP: No follow-ups on file.  Signed:  Crissie Sickles, MD           01/18/2019

## 2019-02-16 ENCOUNTER — Other Ambulatory Visit: Payer: Self-pay

## 2019-02-16 ENCOUNTER — Ambulatory Visit (INDEPENDENT_AMBULATORY_CARE_PROVIDER_SITE_OTHER): Payer: 59 | Admitting: Family Medicine

## 2019-02-16 ENCOUNTER — Encounter: Payer: Self-pay | Admitting: Family Medicine

## 2019-02-16 VITALS — Temp 97.9°F | Wt 308.6 lb

## 2019-02-16 DIAGNOSIS — U071 COVID-19: Secondary | ICD-10-CM

## 2019-02-16 DIAGNOSIS — Z7189 Other specified counseling: Secondary | ICD-10-CM

## 2019-02-16 NOTE — Progress Notes (Signed)
Virtual Visit via Video Note  I connected with Chad [name] on 02/16/19 at  4:00 PM EST by a video enabled telemedicine application and verified that I am speaking with the correct person using two identifiers.  Location patient: home Location provider:work or home office Persons participating in the virtual visit: patient, provider  I discussed the limitations of evaluation and management by telemedicine and the availability of in person appointments. The patient expressed understanding and agreed to proceed.  Telemedicine visit is a necessity given the COVID-19 restrictions in place at the current time.  HPI: 44 y/o male being seen today for covid infection. Onset 02/06/19 with a feeling of mild chest tightness/chest congestion.   Lost sense of smell several days later, went to get tested 1/18 and came back positive 02/15/19. Mild fatigue.  No HA, ST, abd pain, or n/v/d.  No rash or myalgias/arthralgias.  No fevers. He is gradually feeling better and has gotten his sense of smell back.  His wife Estill Bamberg is on the phone by speaker so she is a part of this call today as well.  She has URI sx's and has been around [place].  She got covid test but result is pending. Lots of questions came up about who should get tested, when to test, how long to quarantine, what sx's to watch for, etc.   ROS: See pertinent positives and negatives per HPI.  Past Medical History:  Diagnosis Date  . Elevated blood pressure reading without diagnosis of hypertension 12/31/2011  . Hypertriglyceridemia 10/2009   mild  . Insulin resistance 10/2009   HbA1C 6.3%  . Obesities, morbid ([place])   . Transaminasemia 10/2009   presumed fatty liver    Past Surgical History:  Procedure Laterality Date  . MOUTH SURGERY  1991    Family History  Problem Relation Age of Onset  . Cancer Mother        breast      Current Outpatient Medications:  Marland Kitchen  Multiple Vitamin (MULTIVITAMIN) tablet, Take 1 tablet by mouth daily.  , Disp: ,  Rfl:  .  Probiotic Product (MISC INTESTINAL FLORA REGULAT) CHEW, Try Digestive Advantage gummies by [place] for any month you are on antibiotics, Disp: , Rfl: 0 .  Psyllium (METAMUCIL PO), Take by mouth., Disp: , Rfl:   EXAM:  VITALS per patient if applicable: Temp [id number] F (36.6 C) (Oral)   Wt (!) 308 lb 9.6 oz (140 kg)   BMI 43.04 kg/m    GENERAL: alert, oriented, appears well and in no acute distress  HEENT: atraumatic, conjunttiva clear, no obvious abnormalities on inspection of external nose and ears  NECK: normal movements of the head and neck  LUNGS: on inspection no signs of respiratory distress, breathing rate appears normal, no obvious gross SOB, gasping or wheezing  CV: no obvious cyanosis  MS: moves all visible extremities without noticeable abnormality  PSYCH/NEURO: pleasant and cooperative, no obvious depression or anxiety, speech and thought processing grossly intact  LABS: none today  Covid 19 positive by testing at [facility] on 02/15/19.    Chemistry      Component Value Date/Time   NA 140 07/19/2018 1022   K 4.3 07/19/2018 1022   CL 104 07/19/2018 1022   CO2 29 07/19/2018 1022   BUN 15 07/19/2018 1022   CREATININE 0.79 07/19/2018 1022      Component Value Date/Time   CALCIUM 9.0 07/19/2018 1022   ALKPHOS 86 07/19/2018 1022   AST 13 07/19/2018 1022  ALT 21 07/19/2018 1022   BILITOT 0.5 07/19/2018 1022     Lab Results  Component Value Date   WBC 10.3 07/19/2018   HGB 13.5 07/19/2018   HCT 40.9 07/19/2018   MCV 83.4 07/19/2018   PLT 273.0 07/19/2018   Lab Results  Component Value Date   HGBA1C 5.5 07/19/2018    ASSESSMENT AND PLAN:  Discussed the following assessment and plan:  Covid 19 infection, Chad [name] improving. Discussed various scenarios today, mainly regarding whether or not to test their asymptomatic son who Chad [name] was around a few nights ago for a short period.  The son was also around the patient for a longer period for several days  PRIOR to onset of Josh's illness, so I suspect he has already been exposed to covid 67 and has passed the period where he would get symptomatic if he is infected.  I reassured them and said I don't think the son needs to get tested. I told them that his wife Estill Bamberg, who has clinical sx's and exposure to him (a positive covid 19 case) should quarantine AS IF she has covid 19 infection EVEN IF her covid test comes back neg. For Josh, his time of quarantine needs to be 14 d from onset of his sx's, plus feeling improvement in sx's and no fever (off of antipyretics) for the 3 days at the end of the quarantine period. This would be ending 02/20/19 and he could return to work 02/21/19. We reviewed symptomatic care for covid 19 illness and the signs/symptoms to watch for that would prompt further evaluation, including but not limited to high/persistent fevers, dehydration, shortness of breath, chest pains, confusion, etc.   I discussed the assessment and treatment plan with the patient. The patient was provided an opportunity to ask questions and all were answered. The patient agreed with the plan and demonstrated an understanding of the instructions.   The patient was advised to call back or seek an in-person evaluation if the symptoms worsen or if the condition fails to improve as anticipated.  Spent 20 min with Chad [name] today, with >50% of this time spent in counseling and care coordination regarding the above problems.  F/u: if not improving appropriately or if worsening  Signed:  Crissie Sickles, MD           02/16/2019

## 2019-12-02 ENCOUNTER — Other Ambulatory Visit: Payer: Self-pay

## 2019-12-05 ENCOUNTER — Encounter: Payer: Self-pay | Admitting: Family Medicine

## 2019-12-05 ENCOUNTER — Ambulatory Visit: Payer: 59 | Admitting: Family Medicine

## 2019-12-05 ENCOUNTER — Other Ambulatory Visit: Payer: Self-pay

## 2019-12-05 VITALS — BP 146/90 | HR 91 | Temp 97.8°F | Wt 355.4 lb

## 2019-12-05 DIAGNOSIS — E781 Pure hyperglyceridemia: Secondary | ICD-10-CM | POA: Diagnosis not present

## 2019-12-05 DIAGNOSIS — Z87898 Personal history of other specified conditions: Secondary | ICD-10-CM

## 2019-12-05 DIAGNOSIS — I10 Essential (primary) hypertension: Secondary | ICD-10-CM

## 2019-12-05 MED ORDER — IRBESARTAN 150 MG PO TABS: 150.0000 mg | ORAL_TABLET | Freq: Every day | ORAL | 0 refills | Status: DC

## 2019-12-05 NOTE — Patient Instructions (Signed)
DASH Eating Plan DASH stands for "Dietary Approaches to Stop Hypertension." The DASH eating plan is a healthy eating plan that has been shown to reduce high blood pressure (hypertension). It may also reduce your risk for type 2 diabetes, heart disease, and stroke. The DASH eating plan may also help with weight loss. What are tips for following this plan?  General guidelines  Avoid eating more than 2,300 mg (milligrams) of salt (sodium) a day. If you have hypertension, you may need to reduce your sodium intake to 1,500 mg a day.  Limit alcohol intake to no more than 1 drink a day for nonpregnant women and 2 drinks a day for men. One drink equals 12 oz of beer, 5 oz of wine, or 1 oz of hard liquor.  Work with your health care provider to maintain a healthy body weight or to lose weight. Ask what an ideal weight is for you.  Get at least 30 minutes of exercise that causes your heart to beat faster (aerobic exercise) most days of the week. Activities may include walking, swimming, or biking.  Work with your health care provider or diet and nutrition specialist (dietitian) to adjust your eating plan to your individual calorie needs. Reading food labels   Check food labels for the amount of sodium per serving. Choose foods with less than 5 percent of the Daily Value of sodium. Generally, foods with less than 300 mg of sodium per serving fit into this eating plan.  To find whole grains, look for the word "whole" as the first word in the ingredient list. Shopping  Buy products labeled as "low-sodium" or "no salt added."  Buy fresh foods. Avoid canned foods and premade or frozen meals. Cooking  Avoid adding salt when cooking. Use salt-free seasonings or herbs instead of table salt or sea salt. Check with your health care provider or pharmacist before using salt substitutes.  Do not fry foods. Cook foods using healthy methods such as baking, boiling, grilling, and broiling instead.  Cook with  heart-healthy oils, such as olive, canola, soybean, or sunflower oil. Meal planning  Eat a balanced diet that includes: ? 5 or more servings of fruits and vegetables each day. At each meal, try to fill half of your plate with fruits and vegetables. ? Up to 6-8 servings of whole grains each day. ? Less than 6 oz of lean meat, poultry, or fish each day. A 3-oz serving of meat is about the same size as a deck of cards. One egg equals 1 oz. ? 2 servings of low-fat dairy each day. ? A serving of nuts, seeds, or beans 5 times each week. ? Heart-healthy fats. Healthy fats called Omega-3 fatty acids are found in foods such as flaxseeds and coldwater fish, like sardines, salmon, and mackerel.  Limit how much you eat of the following: ? Canned or prepackaged foods. ? Food that is high in trans fat, such as fried foods. ? Food that is high in saturated fat, such as fatty meat. ? Sweets, desserts, sugary drinks, and other foods with added sugar. ? Full-fat dairy products.  Do not salt foods before eating.  Try to eat at least 2 vegetarian meals each week.  Eat more home-cooked food and less restaurant, buffet, and fast food.  When eating at a restaurant, ask that your food be prepared with less salt or no salt, if possible. What foods are recommended? The items listed may not be a complete list. Talk with your dietitian about   what dietary choices are best for you. Grains Whole-grain or whole-wheat bread. Whole-grain or whole-wheat pasta. Brown rice. Oatmeal. Quinoa. Bulgur. Whole-grain and low-sodium cereals. Pita bread. Low-fat, low-sodium crackers. Whole-wheat flour tortillas. Vegetables Fresh or frozen vegetables (raw, steamed, roasted, or grilled). Low-sodium or reduced-sodium tomato and vegetable juice. Low-sodium or reduced-sodium tomato sauce and tomato paste. Low-sodium or reduced-sodium canned vegetables. Fruits All fresh, dried, or frozen fruit. Canned fruit in natural juice (without  added sugar). Meat and other protein foods Skinless chicken or turkey. Ground chicken or turkey. Pork with fat trimmed off. Fish and seafood. Egg whites. Dried beans, peas, or lentils. Unsalted nuts, nut butters, and seeds. Unsalted canned beans. Lean cuts of beef with fat trimmed off. Low-sodium, lean deli meat. Dairy Low-fat (1%) or fat-free (skim) milk. Fat-free, low-fat, or reduced-fat cheeses. Nonfat, low-sodium ricotta or cottage cheese. Low-fat or nonfat yogurt. Low-fat, low-sodium cheese. Fats and oils Soft margarine without trans fats. Vegetable oil. Low-fat, reduced-fat, or light mayonnaise and salad dressings (reduced-sodium). Canola, safflower, olive, soybean, and sunflower oils. Avocado. Seasoning and other foods Herbs. Spices. Seasoning mixes without salt. Unsalted popcorn and pretzels. Fat-free sweets. What foods are not recommended? The items listed may not be a complete list. Talk with your dietitian about what dietary choices are best for you. Grains Baked goods made with fat, such as croissants, muffins, or some breads. Dry pasta or rice meal packs. Vegetables Creamed or fried vegetables. Vegetables in a cheese sauce. Regular canned vegetables (not low-sodium or reduced-sodium). Regular canned tomato sauce and paste (not low-sodium or reduced-sodium). Regular tomato and vegetable juice (not low-sodium or reduced-sodium). Pickles. Olives. Fruits Canned fruit in a light or heavy syrup. Fried fruit. Fruit in cream or butter sauce. Meat and other protein foods Fatty cuts of meat. Ribs. Fried meat. Bacon. Sausage. Bologna and other processed lunch meats. Salami. Fatback. Hotdogs. Bratwurst. Salted nuts and seeds. Canned beans with added salt. Canned or smoked fish. Whole eggs or egg yolks. Chicken or turkey with skin. Dairy Whole or 2% milk, cream, and half-and-half. Whole or full-fat cream cheese. Whole-fat or sweetened yogurt. Full-fat cheese. Nondairy creamers. Whipped toppings.  Processed cheese and cheese spreads. Fats and oils Butter. Stick margarine. Lard. Shortening. Ghee. Bacon fat. Tropical oils, such as coconut, palm kernel, or palm oil. Seasoning and other foods Salted popcorn and pretzels. Onion salt, garlic salt, seasoned salt, table salt, and sea salt. Worcestershire sauce. Tartar sauce. Barbecue sauce. Teriyaki sauce. Soy sauce, including reduced-sodium. Steak sauce. Canned and packaged gravies. Fish sauce. Oyster sauce. Cocktail sauce. Horseradish that you find on the shelf. Ketchup. Mustard. Meat flavorings and tenderizers. Bouillon cubes. Hot sauce and Tabasco sauce. Premade or packaged marinades. Premade or packaged taco seasonings. Relishes. Regular salad dressings. Where to find more information:  National Heart, Lung, and Blood Institute: www.nhlbi.nih.gov  American Heart Association: www.heart.org Summary  The DASH eating plan is a healthy eating plan that has been shown to reduce high blood pressure (hypertension). It may also reduce your risk for type 2 diabetes, heart disease, and stroke.  With the DASH eating plan, you should limit salt (sodium) intake to 2,300 mg a day. If you have hypertension, you may need to reduce your sodium intake to 1,500 mg a day.  When on the DASH eating plan, aim to eat more fresh fruits and vegetables, whole grains, lean proteins, low-fat dairy, and heart-healthy fats.  Work with your health care provider or diet and nutrition specialist (dietitian) to adjust your eating plan to your   individual calorie needs. This information is not intended to replace advice given to you by your health care provider. Make sure you discuss any questions you have with your health care provider. Document Revised: 12/26/2016 Document Reviewed: 01/07/2016 Elsevier Patient Education  2020 [employer]  

## 2019-12-05 NOTE — Progress Notes (Signed)
OFFICE VISIT  12/05/2019  CC:  Chief Complaint  Patient presents with  . Hypertension    HPI:    Patient is a 44 y.o. Caucasian male who presents for f/u HTN. He has declined antihypertensive med in the past, electing to try to improve TLC/wt loss efforts as first line tx. Also has hx of morb obesity, prediabetes, and hypertriglyceridemia (metabolic syndrome).  Home bp monitoring: 140-149 syst range, 95-100 over the last few weeks. Has had periods of some HAs, very stressed. Diet and exercise habits not so good lately. 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    Past Medical History:  Diagnosis Date  . Essential hypertension   . Hypertriglyceridemia 10/2009   mild  . Insulin resistance 10/2009   HbA1C 6.3%  . Obesities, morbid ([place])   . Transaminasemia 10/2009   presumed fatty liver    Past Surgical History:  Procedure Laterality Date  . MOUTH SURGERY  1991    Outpatient Medications Prior to Visit  Medication Sig Dispense Refill  . Multiple Vitamin (MULTIVITAMIN) tablet Take 1 tablet by mouth daily.      . Psyllium (METAMUCIL PO) Take by mouth.     No facility-administered medications prior to visit.    Allergies  Allergen Reactions  . Aspirin Hives  . Aspirin-Acetaminophen-Caffeine   . Ibuprofen Hives  . Naproxen Sodium     ROS As per HPI  PE: Vitals with BMI 12/05/2019 02/16/2019 07/19/2018  Height - - 5\' 11"   Weight 355 lbs 6 oz 308 lbs 10 oz 320 lbs 13 oz  BMI - - 96.29  Systolic 528 - 413  Diastolic 90 - 82  Pulse 91 - 70    Gen: Alert, well appearing.  Patient is oriented to person, place, time, and situation. AFFECT: pleasant, lucid thought and speech. CV: RRR, no m/r/g.   LUNGS: CTA bilat, nonlabored resps, good aeration in all lung fields. EXT: no clubbing or cyanosis.  no edema.    LABS:  Lab Results  Component Value Date   TSH 2.50  07/19/2018   Lab Results  Component Value Date   WBC 10.3 07/19/2018   HGB 13.5 07/19/2018   HCT 40.9 07/19/2018   MCV 83.4 07/19/2018   PLT 273.0 07/19/2018   Lab Results  Component Value Date   CREATININE 0.79 07/19/2018   BUN 15 07/19/2018   NA 140 07/19/2018   K 4.3 07/19/2018   CL 104 07/19/2018   CO2 29 07/19/2018   Lab Results  Component Value Date   ALT 21 07/19/2018   AST 13 07/19/2018   ALKPHOS 86 07/19/2018   BILITOT 0.5 07/19/2018   Lab Results  Component Value Date   CHOL 191 07/19/2018   Lab Results  Component Value Date   HDL 50.80 07/19/2018   Lab Results  Component Value Date   LDLCALC 123 (H) 07/19/2018   Lab Results  Component Value Date   TRIG 88.0 07/19/2018   Lab Results  Component Value Date   CHOLHDL 4 07/19/2018   Lab Results  Component Value Date   HGBA1C 5.5 07/19/2018    IMPRESSION AND PLAN:  Ess hypertension; needs to start med and he is now ready to do so. Irbesartan 150mg  qd, #30, no RF. Therapeutic expectations and side effect profile of medication discussed today.  Patient's questions answered. DASH diet discussed, handed to pt today. He'll monitor bp/hr  daily at home and we'll review at f/u.  Goal <130/80.  Fasting labs at f/u in 2 wks, cpe at that time as well.  An After Visit Summary was printed and given to the patient.  FOLLOW UP: Return in about 2 weeks (around 12/19/2019) for annual CPE (fasting).  Signed:  Crissie Sickles, MD           12/05/2019

## 2019-12-28 ENCOUNTER — Other Ambulatory Visit: Payer: Self-pay | Admitting: Family Medicine

## 2020-01-30 ENCOUNTER — Other Ambulatory Visit: Payer: Self-pay | Admitting: Family Medicine

## 2020-03-03 ENCOUNTER — Other Ambulatory Visit: Payer: Self-pay | Admitting: Family Medicine

## 2020-03-07 ENCOUNTER — Encounter: Payer: Self-pay | Admitting: Family Medicine

## 2020-03-07 ENCOUNTER — Other Ambulatory Visit: Payer: Self-pay

## 2020-03-07 MED ORDER — IRBESARTAN 150 MG PO TABS: 150.0000 mg | ORAL_TABLET | Freq: Every day | ORAL | 0 refills | Status: DC

## 2020-03-07 NOTE — Telephone Encounter (Signed)
Pt needs in person f/u b/c have to monitor labs on his bp med, esp since no labs done since starting med. OK to authorize RF of irbesartan 150mg  qd, #30, no additional RFs. Looks like we discussed these f/u guidelines at last visit 11/2019. -thx

## 2020-03-12 ENCOUNTER — Encounter: Payer: Self-pay | Admitting: Family Medicine

## 2020-03-12 ENCOUNTER — Ambulatory Visit: Payer: 59 | Admitting: Family Medicine

## 2020-03-12 ENCOUNTER — Other Ambulatory Visit: Payer: Self-pay

## 2020-03-12 VITALS — BP 159/102 | HR 75 | Temp 97.8°F | Resp 16 | Ht 71.0 in | Wt 368.6 lb

## 2020-03-12 DIAGNOSIS — I10 Essential (primary) hypertension: Secondary | ICD-10-CM

## 2020-03-12 LAB — BASIC METABOLIC PANEL
BUN: 13 mg/dL (ref 6–23)
CO2: 27 mEq/L (ref 19–32)
Calcium: 9.2 mg/dL (ref 8.4–10.5)
Chloride: 103 mEq/L (ref 96–112)
Creatinine, Ser: 0.77 mg/dL (ref 0.40–1.50)
GFR: 108.72 mL/min (ref >60.00)
Glucose, Bld: 89 mg/dL (ref 70–99)
Potassium: 4 mEq/L (ref 3.5–5.1)
Sodium: 138 mEq/L (ref 135–145)

## 2020-03-12 MED ORDER — IRBESARTAN 300 MG PO TABS: 300.0000 mg | ORAL_TABLET | Freq: Every day | ORAL | 1 refills | Status: AC

## 2020-03-12 NOTE — Progress Notes (Signed)
Office Note 03/12/2020  CC:  Chief Complaint  Patient presents with  . Follow-up    Hypertension, pt is not fasting    HPI:  Chad [name] is a 45 y.o. White male who is here for f/u HTN. I last saw him 12/05/2019. A/P as of that visit: "Ess hypertension; needs to start med and he is now ready to do so. Irbesartan 150mg  qd, #30, no RF. Therapeutic expectations and side effect profile of medication discussed today.  Patient's questions answered. DASH diet discussed, handed to pt today. He'll monitor bp/hr daily at home and we'll review at f/u.  Goal <130/80.  Fasting labs at f/u in 2 wks, cpe at that time as well."  INTERIM HX: Pt did not return for f/u appt.   He sent MyChart message recently (03/07/20) to report essentially no change in home bp measurements since getting on irbesartan.  Denies any acute complaints. Has not been eating very healthy, has not been exercising.  Very busy with family, job, holidays, etc. Admits he has not been making great effort but wants to start.  ROS: no fevers, no CP, no SOB, no wheezing, no cough, no dizziness, no HAs, no rashes, no melena/hematochezia.  No polyuria or polydipsia.  No myalgias or arthralgias.  No focal weakness, paresthesias, or tremors.  No acute vision or hearing abnormalities. No n/v/d or abd pain.  No palpitations.     Past Medical History:  Diagnosis Date  . Essential hypertension   . Hypertriglyceridemia 10/2009   mild  . Insulin resistance 10/2009   HbA1C 6.3%  . Obesities, morbid ([place])   . Transaminasemia 10/2009   presumed fatty liver    Past Surgical History:  Procedure Laterality Date  . MOUTH SURGERY  1991    Family History  Problem Relation Age of Onset  . Cancer Mother        breast    Social History   Socioeconomic History  . Marital status: Married    Spouse name: Not on file  . Number of children: 1  . Years of education: Not on file  . Highest education level: Not on file   Occupational History  . Occupation: Therapist, occupational: [employer]  Tobacco Use  . Smoking status: Former Smoker    Packs/day: 0.30    Years: 8.00    Pack years: 2.40    Types: Cigarettes    Quit date: 01/27/2001    Years since quitting: 19.1  . Smokeless tobacco: Former Network engineer and Sexual Activity  . Alcohol use: Yes    Alcohol/week: 2.0 standard drinks    Types: 2 Cans of beer per week    Comment: occ beer  . Drug use: No  . Sexual activity: Not on file  Other Topics Concern  . Not on file  Social History Narrative   Married, has 70 y/o son, lives in [place], [place].  Originally from [place], [place].   Alcohol use-no   Drug use-no   Exercise: intermittent, treadmill and elliptical machine.   Active in [employer].   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         Social Determinants of Health   Financial Resource Strain: Not on file  Food Insecurity: Not on file  Transportation Needs: Not on file  Physical Activity: Not on file  Stress: Not on file  Social Connections: Not on file  Intimate Partner Violence: Not on file    Outpatient  Medications Prior to Visit  Medication Sig Dispense Refill  . Multiple Vitamin (MULTIVITAMIN) tablet Take 1 tablet by mouth daily.    . Psyllium (METAMUCIL PO) Take by mouth.    . irbesartan (AVAPRO) 150 MG tablet Take 1 tablet (150 mg total) by mouth daily. 30 tablet 0   No facility-administered medications prior to visit.    Allergies  Allergen Reactions  . Aspirin Hives  . Aspirin-Acetaminophen-Caffeine   . Ibuprofen Hives  . Naproxen Sodium     PE; Vitals with BMI 03/12/2020 12/05/2019 02/16/2019  Height 5\' 11"  - -  Weight 368 lbs 10 oz 355 lbs 6 oz 308 lbs 10 oz  BMI 28.76 - -  Systolic 811 572 -  Diastolic 620 90 -  Pulse 75 91 -    Gen: Alert, well appearing.  Patient is oriented to person, place, time, and situation. AFFECT: pleasant, lucid thought and speech. CV: RRR, no m/r/g.    LUNGS: CTA bilat, nonlabored resps, good aeration in all lung fields. EXT: no clubbing or cyanosis.  no edema.   Pertinent labs:  Lab Results  Component Value Date   TSH 2.50 07/19/2018   Lab Results  Component Value Date   WBC 10.3 07/19/2018   HGB 13.5 07/19/2018   HCT 40.9 07/19/2018   MCV 83.4 07/19/2018   PLT 273.0 07/19/2018   Lab Results  Component Value Date   CREATININE 0.79 07/19/2018   BUN 15 07/19/2018   NA 140 07/19/2018   K 4.3 07/19/2018   CL 104 07/19/2018   CO2 29 07/19/2018   Lab Results  Component Value Date   ALT 21 07/19/2018   AST 13 07/19/2018   ALKPHOS 86 07/19/2018   BILITOT 0.5 07/19/2018   Lab Results  Component Value Date   CHOL 191 07/19/2018   Lab Results  Component Value Date   HDL 50.80 07/19/2018   Lab Results  Component Value Date   LDLCALC 123 (H) 07/19/2018   Lab Results  Component Value Date   TRIG 88.0 07/19/2018   Lab Results  Component Value Date   CHOLHDL 4 07/19/2018   Lab Results  Component Value Date   HGBA1C 5.5 07/19/2018   ASSESSMENT AND PLAN:   Uncontrolled HTN. Increase irbesartan to 300mg  qd (double up on 150mg  tabs he currently has, then fill 300 mg irbesartan rx I sent in today). Discussed importance of low Na intake, inc exercise, better overall diet, wt loss etc. He definitely understands and is motivated to make changes. Cont home bp monitoring and we'll review #s in 2 wks, recheck lytes/cr at that time. Most likely will need a second med to get to goal 130/80.  An After Visit Summary was printed and given to the patient.  FOLLOW UP:  Return in about 2 weeks (around 03/26/2020) for f/u HTN.  Signed:  Crissie Sickles, MD           03/12/2020

## 2020-04-13 ENCOUNTER — Other Ambulatory Visit: Payer: Self-pay | Admitting: Family Medicine

## 2020-07-23 DIAGNOSIS — L03114 Cellulitis of left upper limb: Secondary | ICD-10-CM | POA: Diagnosis not present

## 2020-07-23 DIAGNOSIS — I10 Essential (primary) hypertension: Secondary | ICD-10-CM | POA: Diagnosis not present

## 2020-10-23 DIAGNOSIS — F3289 Other specified depressive episodes: Secondary | ICD-10-CM | POA: Diagnosis not present

## 2021-10-19 ENCOUNTER — Ambulatory Visit
Admission: EM | Admit: 2021-10-19 | Discharge: 2021-10-19 | Disposition: A | Discharge status: Home / Self Care | Payer: 59 | Attending: Emergency Medicine | Admitting: Emergency Medicine

## 2021-10-19 DIAGNOSIS — R3911 Hesitancy of micturition: Secondary | ICD-10-CM

## 2021-10-19 DIAGNOSIS — R350 Frequency of micturition: Secondary | ICD-10-CM

## 2021-10-19 DIAGNOSIS — R300 Dysuria: Secondary | ICD-10-CM

## 2021-10-19 LAB — POCT URINALYSIS DIP (MANUAL ENTRY)
Bilirubin, UA: NEGATIVE
Glucose, UA: NEGATIVE mg/dL
Ketones, POC UA: NEGATIVE mg/dL
Nitrite, UA: NEGATIVE
Protein Ur, POC: 30 mg/dL — AB
Spec Grav, UA: 1.02 (ref 1.010–1.025)
Urobilinogen, UA: 0.2 E.U./dL
pH, UA: 7 (ref 5.0–8.0)

## 2021-10-19 MED ORDER — SULFAMETHOXAZOLE-TRIMETHOPRIM 800-160 MG PO TABS: 1.0000 | ORAL_TABLET | Freq: Two times a day (BID) | ORAL | 0 refills | Status: AC

## 2021-10-19 NOTE — ED Triage Notes (Signed)
Patient to Urgent Care with complaints of left sided flank pain. Reports that he is having some dysuria at the end of urination and difficulty completely emptying his bladder. Symptoms started yesterday around 2pm. Patient had chills/ sweats yesterday afternoon.  Reports in April- flank pain, diagnosed with a kidney infection. Reports he was given a rocephin shot and given a course of doxy. Patient did not complete the doxy d/t reaction (mouth ulcers).

## 2021-10-19 NOTE — ED Provider Notes (Signed)
[facility]    CSN: [id number] Arrival date & time: 10/19/21  0849      History   Chief Complaint Chief Complaint  Patient presents with   Flank Pain   Dysuria    HPI Chad [name] is a 46 y.o. male.  Patient presents with 1 day history of dysuria, urinary frequency, urinary hesitancy.  He also reports chills and sweats.  No fever, abdominal pain, flank pain, nausea, vomiting, diarrhea, constipation, penile discharge, testicular pain, or other symptoms.  No treatments at home.  Patient reports similar symptoms in April 2023; he was seen in an urgent care in [place]; diagnosed with urinary and kidney infection; given injection of Rocephin and treated with doxycycline; patient did not complete the doxycycline due to mouth ulcers.  His medical history includes morbid obesity, fatty liver, palpitations, hyperglycemia, hypertension.  The history is provided by the patient and medical records.    Past Medical History:  Diagnosis Date   Essential hypertension    Hypertriglyceridemia 10/2009   mild   Insulin resistance 10/2009   HbA1C 6.3%   Obesities, morbid ([place])    Transaminasemia 10/2009   presumed fatty liver    Patient Active Problem List   Diagnosis Date Noted   Low back strain 06/29/2013   Elevated BP 12/31/2011   Molluscum contagiosum 08/11/2011   Health maintenance examination 07/15/2011   Gastroenteritis 04/08/2011   YEAST BALANITIS 04/11/2010   PENILE LESION 04/11/2010   LEUKOCYTOSIS 03/07/2010   FATTY LIVER DISEASE 11/19/2009   HYPERGLYCEMIA 11/19/2009   OBESITY, MORBID 05/01/2008   PALPITATIONS, RECURRENT 05/01/2008    Past Surgical History:  Procedure Laterality Date   MOUTH SURGERY  1991       Home Medications    Prior to Admission medications   Medication Sig Start Date End Date Taking? Authorizing Provider  sulfamethoxazole-trimethoprim (BACTRIM DS) 800-160 MG tablet Take 1 tablet by mouth 2 (two) times daily for 7 days. 10/19/21  10/26/21 Yes Sharion Balloon, NP  irbesartan (AVAPRO) 300 MG tablet Take 1 tablet (300 mg total) by mouth daily. 03/12/20   McGowen, Adrian Blackwater, MD  Multiple Vitamin (MULTIVITAMIN) tablet Take 1 tablet by mouth daily.    [provider]  Psyllium (METAMUCIL PO) Take by mouth.    [provider]    Family History Family History  Problem Relation Age of Onset   Cancer Mother        breast    Social History Social History   Tobacco Use   Smoking status: Former    Packs/day: 0.30    Years: 8.00    Total pack years: 2.40    Types: Cigarettes    Quit date: 01/27/2001    Years since quitting: 20.7   Smokeless tobacco: Former  Substance Use Topics   Alcohol use: Yes    Alcohol/week: 2.0 standard drinks of alcohol    Types: 2 Cans of beer per week    Comment: occ beer   Drug use: No     Allergies   Aspirin, Aspirin-acetaminophen-caffeine, Doxycycline, Ibuprofen, and Naproxen sodium   Review of Systems Review of Systems  Constitutional:  Positive for chills. Negative for fever.  Gastrointestinal:  Negative for abdominal pain, blood in stool, constipation, diarrhea, nausea and vomiting.  Genitourinary:  Positive for dysuria and frequency. Negative for flank pain, hematuria, penile discharge and testicular pain.  Skin:  Negative for color change and rash.  All other systems reviewed and are negative.    Physical  Exam Triage Vital Signs ED Triage Vitals  Enc Vitals Group     BP 10/19/21 0928 (!) 159/84     Pulse Rate 10/19/21 0928 100     Resp 10/19/21 0928 18     Temp 10/19/21 0928 98.3 F (36.8 C)     Temp src --      SpO2 10/19/21 0928 96 %     Weight 10/19/21 0935 (!) 368 lb 9.8 oz (167.2 kg)     Height 10/19/21 0935 '5\' 11"'$  (1.803 m)     Head Circumference --      Peak Flow --      Pain Score 10/19/21 0927 2     Pain Loc --      Pain Edu? --      Excl. in [place]? --    No data found.  Updated Vital Signs BP (!) 159/84   Pulse 100   Temp 98.3 F  (36.8 C)   Resp 18   Ht '5\' 11"'$  (1.803 m)   Wt (!) 368 lb 9.8 oz (167.2 kg)   SpO2 96%   BMI 51.41 kg/m   Visual Acuity Right Eye Distance:   Left Eye Distance:   Bilateral Distance:    Right Eye Near:   Left Eye Near:    Bilateral Near:     Physical Exam Vitals and nursing note reviewed.  Constitutional:      General: He is not in acute distress.    Appearance: He is well-developed. He is obese. He is not ill-appearing.  HENT:     Mouth/Throat:     Mouth: Mucous membranes are moist.  Cardiovascular:     Rate and Rhythm: Normal rate and regular rhythm.     Heart sounds: Normal heart sounds.  Pulmonary:     Effort: Pulmonary effort is normal. No respiratory distress.     Breath sounds: Normal breath sounds.  Abdominal:     General: Bowel sounds are normal.     Palpations: Abdomen is soft.     Tenderness: There is no abdominal tenderness. There is no right CVA tenderness, left CVA tenderness, guarding or rebound.  Musculoskeletal:     Cervical back: Neck supple.  Skin:    General: Skin is warm and dry.  Neurological:     Mental Status: He is alert.  Psychiatric:        Mood and Affect: Mood normal.        Behavior: Behavior normal.      UC Treatments / Results  Labs (all labs ordered are listed, but only abnormal results are displayed) Labs Reviewed  POCT URINALYSIS DIP (MANUAL ENTRY) - Abnormal; Notable for the following components:      Result Value   Clarity, UA cloudy (*)    Blood, UA trace-lysed (*)    Protein Ur, POC =30 (*)    Leukocytes, UA Moderate (2+) (*)    All other components within normal limits    EKG   Radiology No results found.  Procedures Procedures (including critical care time)  Medications Ordered in UC Medications - No data to display  Initial Impression / Assessment and Plan / UC Course  I have reviewed the triage vital signs and the nursing notes.  Pertinent labs & imaging results that were available during my care of  the patient were reviewed by me and considered in my medical decision making (see chart for details).   Dysuria, urinary frequency, urinary hesitancy.  Patient has an appointment scheduled  in mid October with his PCP.  Treating today with Bactrim.  Urine culture pending.  Education provided on dysuria and urinary frequency.  Instructed patient to follow-up with his PCP as soon as possible.  ED precautions discussed.  Patient agrees to plan of care.   Final Clinical Impressions(s) / UC Diagnoses   Final diagnoses:  Dysuria  Urinary frequency  Urinary hesitancy     Discharge Instructions      Take the antibiotic as directed.  The urine culture is pending.  We will call you if it shows the need to change or discontinue your antibiotic.    Follow up with your primary care provider.       ED Prescriptions     Medication Sig Dispense Auth. Provider   sulfamethoxazole-trimethoprim (BACTRIM DS) 800-160 MG tablet Take 1 tablet by mouth 2 (two) times daily for 7 days. 14 tablet Sharion Balloon, NP      PDMP not reviewed this encounter.   Sharion Balloon, NP 10/19/21 1023

## 2021-10-19 NOTE — Discharge Instructions (Addendum)
Take the antibiotic as directed.  The urine culture is pending.  We will call you if it shows the need to change or discontinue your antibiotic.    Follow up with your primary care provider.    

## 2022-01-15 NOTE — Progress Notes (Signed)
This encounter was created in error - please disregard.
